# Patient Record
Sex: Female | Born: 1935 | Race: White | Hispanic: No | Smoking: Former smoker
Health system: Southern US, Community
[De-identification: ages and names within clinical notes are randomized; demographics above are authoritative.]

## PROBLEM LIST (undated history)

## (undated) DIAGNOSIS — K219 Gastro-esophageal reflux disease without esophagitis: Secondary | ICD-10-CM

## (undated) DIAGNOSIS — J45909 Unspecified asthma, uncomplicated: Secondary | ICD-10-CM

## (undated) DIAGNOSIS — G20 Parkinson's disease: Secondary | ICD-10-CM

## (undated) DIAGNOSIS — H532 Diplopia: Secondary | ICD-10-CM

## (undated) DIAGNOSIS — E876 Hypokalemia: Secondary | ICD-10-CM

## (undated) DIAGNOSIS — I10 Essential (primary) hypertension: Secondary | ICD-10-CM

## (undated) DIAGNOSIS — C50919 Malignant neoplasm of unspecified site of unspecified female breast: Secondary | ICD-10-CM

## (undated) DIAGNOSIS — M1990 Unspecified osteoarthritis, unspecified site: Secondary | ICD-10-CM

## (undated) DIAGNOSIS — K56609 Unspecified intestinal obstruction, unspecified as to partial versus complete obstruction: Secondary | ICD-10-CM

## (undated) DIAGNOSIS — I951 Orthostatic hypotension: Secondary | ICD-10-CM

## (undated) DIAGNOSIS — I4891 Unspecified atrial fibrillation: Secondary | ICD-10-CM

## (undated) DIAGNOSIS — M419 Scoliosis, unspecified: Secondary | ICD-10-CM

## (undated) HISTORY — DX: Unspecified intestinal obstruction, unspecified as to partial versus complete obstruction: K56.609

## (undated) HISTORY — DX: Parkinson's disease: G20

## (undated) HISTORY — DX: Diplopia: H53.2

## (undated) HISTORY — DX: Scoliosis, unspecified: M41.9

## (undated) HISTORY — DX: Orthostatic hypotension: I95.1

## (undated) HISTORY — PX: TONSILLECTOMY: SUR1361

## (undated) HISTORY — DX: Unspecified atrial fibrillation: I48.91

## (undated) HISTORY — PX: CATARACT EXTRACTION: SUR2

## (undated) HISTORY — DX: Unspecified osteoarthritis, unspecified site: M19.90

## (undated) HISTORY — DX: Hypokalemia: E87.6

## (undated) HISTORY — PX: MASTECTOMY: SHX3

## (undated) HISTORY — DX: Gastro-esophageal reflux disease without esophagitis: K21.9

## (undated) HISTORY — DX: Unspecified asthma, uncomplicated: J45.909

## (undated) HISTORY — DX: Essential (primary) hypertension: I10

## (undated) HISTORY — DX: Malignant neoplasm of unspecified site of unspecified female breast: C50.919

## (undated) HISTORY — PX: LUMBAR SPINE SURGERY: SHX701

## (undated) HISTORY — PX: TOTAL HIP ARTHROPLASTY: SHX124

---

## 2006-07-05 ENCOUNTER — Encounter: Admission: RE | Admit: 2006-07-05 | Discharge: 2006-07-05 | Payer: Self-pay | Admitting: Orthopaedic Surgery

## 2006-07-08 ENCOUNTER — Inpatient Hospital Stay (HOSPITAL_COMMUNITY): Admission: RE | Admit: 2006-07-08 | Discharge: 2006-07-12 | Payer: Self-pay | Admitting: Orthopaedic Surgery

## 2007-05-16 ENCOUNTER — Inpatient Hospital Stay (HOSPITAL_COMMUNITY): Admission: EM | Admit: 2007-05-16 | Discharge: 2007-05-21 | Payer: Self-pay | Admitting: Emergency Medicine

## 2007-06-01 DIAGNOSIS — K56609 Unspecified intestinal obstruction, unspecified as to partial versus complete obstruction: Secondary | ICD-10-CM | POA: Insufficient documentation

## 2007-06-01 HISTORY — DX: Unspecified intestinal obstruction, unspecified as to partial versus complete obstruction: K56.609

## 2009-02-06 ENCOUNTER — Inpatient Hospital Stay (HOSPITAL_COMMUNITY): Admission: EM | Admit: 2009-02-06 | Discharge: 2009-02-13 | Payer: Self-pay | Admitting: Emergency Medicine

## 2010-06-03 LAB — CBC
HCT: 33.8 % — ABNORMAL LOW (ref 36.0–46.0)
HCT: 34.1 % — ABNORMAL LOW (ref 36.0–46.0)
HCT: 34.4 % — ABNORMAL LOW (ref 36.0–46.0)
Hemoglobin: 11.1 g/dL — ABNORMAL LOW (ref 12.0–15.0)
Hemoglobin: 11.3 g/dL — ABNORMAL LOW (ref 12.0–15.0)
Hemoglobin: 11.4 g/dL — ABNORMAL LOW (ref 12.0–15.0)
Hemoglobin: 11.6 g/dL — ABNORMAL LOW (ref 12.0–15.0)
Hemoglobin: 11.9 g/dL — ABNORMAL LOW (ref 12.0–15.0)
Hemoglobin: 13.2 g/dL (ref 12.0–15.0)
MCHC: 33.1 g/dL (ref 30.0–36.0)
MCHC: 33.6 g/dL (ref 30.0–36.0)
MCV: 95.2 fL (ref 78.0–100.0)
MCV: 96.3 fL (ref 78.0–100.0)
Platelets: 244 10*3/uL (ref 150–400)
RBC: 3.5 MIL/uL — ABNORMAL LOW (ref 3.87–5.11)
RBC: 3.51 MIL/uL — ABNORMAL LOW (ref 3.87–5.11)
RBC: 3.58 MIL/uL — ABNORMAL LOW (ref 3.87–5.11)
RBC: 3.59 MIL/uL — ABNORMAL LOW (ref 3.87–5.11)
RBC: 3.69 MIL/uL — ABNORMAL LOW (ref 3.87–5.11)
RBC: 4.13 MIL/uL (ref 3.87–5.11)
RDW: 13.2 % (ref 11.5–15.5)
RDW: 14.2 % (ref 11.5–15.5)
WBC: 11 10*3/uL — ABNORMAL HIGH (ref 4.0–10.5)
WBC: 13.3 10*3/uL — ABNORMAL HIGH (ref 4.0–10.5)
WBC: 7.7 10*3/uL (ref 4.0–10.5)
WBC: 8 10*3/uL (ref 4.0–10.5)
WBC: 8.7 10*3/uL (ref 4.0–10.5)
WBC: 9.1 10*3/uL (ref 4.0–10.5)

## 2010-06-03 LAB — URINALYSIS, MICROSCOPIC ONLY
Bilirubin Urine: NEGATIVE
Ketones, ur: NEGATIVE mg/dL
Protein, ur: NEGATIVE mg/dL
Urobilinogen, UA: 0.2 mg/dL (ref 0.0–1.0)
pH: 6.5 (ref 5.0–8.0)

## 2010-06-03 LAB — URINE CULTURE: Culture: NO GROWTH

## 2010-06-03 LAB — BASIC METABOLIC PANEL
BUN: 5 mg/dL — ABNORMAL LOW (ref 6–23)
BUN: 7 mg/dL (ref 6–23)
BUN: 7 mg/dL (ref 6–23)
CO2: 27 mEq/L (ref 19–32)
Calcium: 8.4 mg/dL (ref 8.4–10.5)
Calcium: 8.6 mg/dL (ref 8.4–10.5)
Chloride: 102 mEq/L (ref 96–112)
Chloride: 104 mEq/L (ref 96–112)
Chloride: 104 mEq/L (ref 96–112)
Creatinine, Ser: 0.59 mg/dL (ref 0.4–1.2)
Creatinine, Ser: 0.63 mg/dL (ref 0.4–1.2)
GFR calc Af Amer: >60 mL/min (ref >60)
GFR calc Af Amer: >60 mL/min (ref >60)
GFR calc Af Amer: >60 mL/min (ref >60)
GFR calc non Af Amer: >60 mL/min (ref >60)
GFR calc non Af Amer: >60 mL/min (ref >60)
GFR calc non Af Amer: >60 mL/min (ref >60)
GFR calc non Af Amer: >60 mL/min (ref >60)
Glucose, Bld: 127 mg/dL — ABNORMAL HIGH (ref 70–99)
Glucose, Bld: 134 mg/dL — ABNORMAL HIGH (ref 70–99)
Potassium: 3.7 mEq/L (ref 3.5–5.1)
Potassium: 3.9 mEq/L (ref 3.5–5.1)
Potassium: 4.2 mEq/L (ref 3.5–5.1)
Sodium: 136 mEq/L (ref 135–145)
Sodium: 136 mEq/L (ref 135–145)
Sodium: 137 mEq/L (ref 135–145)
Sodium: 138 mEq/L (ref 135–145)

## 2010-06-03 LAB — POCT I-STAT, CHEM 8
BUN: 15 mg/dL (ref 6–23)
Creatinine, Ser: 0.8 mg/dL (ref 0.4–1.2)
Hemoglobin: 13.9 g/dL (ref 12.0–15.0)
Potassium: 3.9 mEq/L (ref 3.5–5.1)
Sodium: 133 mEq/L — ABNORMAL LOW (ref 135–145)

## 2010-06-03 LAB — PROTIME-INR
INR: 0.97 (ref 0.00–1.49)
INR: 2.7 — ABNORMAL HIGH (ref 0.00–1.49)
INR: 3.67 — ABNORMAL HIGH (ref 0.00–1.49)
INR: 4.17 — ABNORMAL HIGH (ref 0.00–1.49)
Prothrombin Time: 16.4 seconds — ABNORMAL HIGH (ref 11.6–15.2)
Prothrombin Time: 19.2 seconds — ABNORMAL HIGH (ref 11.6–15.2)
Prothrombin Time: 36.2 seconds — ABNORMAL HIGH (ref 11.6–15.2)

## 2010-06-03 LAB — DIFFERENTIAL
Lymphocytes Relative: 5 % — ABNORMAL LOW (ref 12–46)
Monocytes Absolute: 0.5 10*3/uL (ref 0.1–1.0)
Monocytes Relative: 4 % (ref 3–12)
Neutro Abs: 11.9 10*3/uL — ABNORMAL HIGH (ref 1.7–7.7)
Neutrophils Relative %: 90 % — ABNORMAL HIGH (ref 43–77)

## 2010-06-03 LAB — URINALYSIS, ROUTINE W REFLEX MICROSCOPIC
Glucose, UA: NEGATIVE mg/dL
Hgb urine dipstick: NEGATIVE
Ketones, ur: NEGATIVE mg/dL
Nitrite: POSITIVE — AB
Protein, ur: NEGATIVE mg/dL
Specific Gravity, Urine: 1.015 (ref 1.005–1.030)
pH: 7.5 (ref 5.0–8.0)

## 2010-06-03 LAB — URINE MICROSCOPIC-ADD ON

## 2010-06-03 LAB — TYPE AND SCREEN: Antibody Screen: NEGATIVE

## 2010-07-15 NOTE — H&P (Signed)
Leslie Stevens, Leslie Stevens             ACCOUNT NO.:  [id number]   MEDICAL RECORD NO.:  [id number]          PATIENT TYPE:  INP   LOCATION:  2010                         FACILITY:  [facility]   PHYSICIAN:  Revonda Standard L. Rennis Harding, N.P. DATE OF BIRTH:  27-Jan-1928   DATE OF ADMISSION:  05/16/2007  DATE OF DISCHARGE:                              HISTORY & PHYSICAL   PRIMARY CARE PHYSICIAN:  Dr. Elana Alm. Nicholos Johns, M.D.   CARDIOLOGIST:  Dr. Corky Crafts, MD   CHIEF COMPLAINT:  Abdominal pain with nausea, vomiting.   HISTORY OF PRESENT ILLNESS:  [name] is a 77 year old female patient,  no prior abdominal surgeries.  This past Friday, she had onset of  anorexia, during the night developed nocturia and by Saturday had  increasing anorexia.  By Saturday night, developed persistent bilious  nausea and vomiting.  Since that time she has had no BM or flatus.  The  patient also developed left lower quadrant abdominal pain which she  described as quite severe.  Her family persuaded her to come to the ER,  where she was evaluated today by the emergency room physician, Dr.  Ethelda Chick.  Her white count was elevated at 18,600 with a left shift.  She has not been having fever, since arrival.  Urinalysis negative.  Her  lipase was mildly elevated at 95, and her CT showed a probable mid-ileal  small bowel obstruction.  Surgical consultation was requested.   REVIEW OF SYSTEMS:  CONSTITUTIONAL:  No fever with the onset of the  symptoms.  GI:  No chronic dysphasia, no acute diarrhea, no chronic  postprandial bloating or pain.  No recent weight gain or weight loss.  She has a history remote of similar symptoms without nausea and vomiting  at that time, crampy abdominal pain and flu-like symptoms.  No fever or  diarrhea with that, spontaneously resolved.  She did seek medical  attention with Dr. Renato Gails at that time.  She also reports chronic  constipation, because of being on chronic pain meds and having low back  pain.  Recently, she has had increased use of the pain medications and  decreased mobility and increased laxative use.  MUSCULOSKELETAL:  As  noted, increasing low back pain, decreasing mobility, increased use of  tramadol.  GENITOURINARY:  She has a history of recurrent UTIs and  marked decreased urine output since the onset of the symptoms,  essentially no urine output since Sunday.   PAST MEDICAL HISTORY:  1. Asthma.  2. Low back pain/osteoarthritis.  3. Hypertension.  4. Duodenal ulcer, remote.   PAST SURGICAL HISTORY:  1. L4-L5 laminectomy/peak cage/bone graft/bruise, and this is in May      of 2008 by Dr. Noel Gerold.  2. Left mastectomy 1992, done at [facility].   SOCIAL HISTORY:  Lives alone.  No alcohol.  No tobacco.   FAMILY HISTORY:  Noncontributory.   ALLERGIES:  NONE.   MEDICATIONS AT HOME:  Include:  Advair metoprolol and Ultram, dosages  unknown.   PHYSICAL EXAM:  GENERAL:  Pleasant female patient complaining of nausea  and vomiting since Saturday night, with left  lower quadrant abdominal  pain.  VITAL SIGNS:  Temperature 97.5, BP 120/79, pulse is 118, respirations  20.  PSYCH:  The patient is alert when x3.  Her affect is appropriate.  NEURO:  Patient is moving all extremities x4 with equal strength and  sensation 5/5.  Cranial nerves II-XII are grossly intact.  HEENT:  Eyes are slightly injected, dry appearing, otherwise normal.  Ear, nose and throat and oral mucous membranes are dry.  Nose is without  rhinorrhea.  Ears are symmetrical in appearance without lesions.  NECK:  Supple.  Trachea midline.  Thyroid not palpable.  CHEST:  Bilateral lung sounds are clear to auscultation.  Respiratory  effort is non-labored.  She is on room air.  CARDIAC:  S1-S2 without rubs, murmurs.  She is slightly tachycardiac on  exam.  No obvious JVD.  Actually, her jugulars appear somewhat  flattened, no peripheral edema.  ABDOMEN:  Distended and soft.  Warm to the touch.  No  surgical scars.  No bowel sounds auscultated.  She is tender in the left lower quadrant,  no guarding and no rebounding.  The abdomen itself feels spongy and  full, and it feels as if when I press down, I am causing bowel  peristalsis with my pressure, possibly moving retained stool around.  No  obvious hepatosplenomegaly, masses or hernias noted.  EXTREMITIES:  Are symmetrical in appearance.  No cyanosis, no clubbing.   LABS:  Sodium 126, potassium 3.3, CO2 32, chloride 80, glucose 120, BUN  25, creatinine 1.03.  White count 18,600 with a neutrophil count of 87%,  hemoglobin is 15.7, platelets 456,000.  Urinalysis negative.  Lipase 95.  Diagnostic CT as noted.  No plain x-rays were done with   IMPRESSION:  1. Small-bowel obstruction with associated nausea and vomiting and      left lower quadrant abdominal pain, uncertain etiology.  2. Volume depletion and dehydration secondary to nausea, vomiting with      associated electrolyte imbalance.  3. Hypertension on beta blocker.  4. Asthma compensated, no respiratory issues.  5. Chronic low back pain on Ultram.  6. Constipation, worsened recently.   PLAN:  1. Admit to the floor as inpatient status n.p.o. NG tube to level of      suction, IV fluid for hydration and bowel rest.  CT also appears as      if she has feces, as well as a bowel obstruction.  Feces is noted      in the colon, as well as there is some air in the colon, and there      appears to be a fecal ball in the rectum area, uncertain if the      small bowel obstruction at this point is coming from obstipation or      other symptoms.  Will at least attempt a Dulcolax tonight, to see      if this will help relieve any constipation.  Also, the CT contrast      moving through the small bowel down through the colon will help      relieve any fecal retention that is causing a bowel obstruction, if      this is the etiology.  2. Repeat CBC, BMET and lipase in the morning.  Repeat  a two-view      abdominal x-ray to follow small bowel obstruction.  3. The patient has leukocytosis.  More than likely this is due to      acute volume depletion,  begin empiric Zosyn for gram-negative      coverage as precaution.  I do not see free air on the CT, so I do      not suspect a bowel perforation or an abscess at this point.  4. The patient has marked electrolyte imbalance with hyponatremia,      hypokalemia, as well as compensatory metabolic alkalosis from her      dehydration.  Will give D5 normal saline which __________ 125 an      hour, increase further if needed.  5. The patient normally on beta blocker hypertension.  To prevent beta      blocker withdrawal, will give low dose IV Lopressor q.6 h. with      hold parameters on the orders.  6. Will utilize IV morphine for pain and Zofran for nausea.  Also,      standing orders of when to call physician, based on vital sign      changes or condition changes.      Allison L. Rennis Harding, N.P.     ALE/MEDQ  D:  05/16/2007  T:  05/16/2007  Job:  [id number]   cc:   Molly Maduro A. Nicholos Johns, M.D.  Corky Crafts, MD

## 2010-07-18 NOTE — Discharge Summary (Signed)
NAMEADLINE, KIRSHENBAUM             ACCOUNT NO.:  [id number]   MEDICAL RECORD NO.:  [id number]          PATIENT TYPE:  INP   LOCATION:  2010                         FACILITY:  [facility]   PHYSICIAN:  Lennie Muckle, MD      DATE OF BIRTH:  [birth year]   DATE OF ADMISSION:  05/16/2007  DATE OF DISCHARGE:  05/21/2007                               DISCHARGE SUMMARY   CHIEF COMPLAINT/REASON FOR ADMISSION:  Ms. Dalby is a 77 year old  female, no prior abdominal surgery, reported abrupt onset of anorexia  over the past 24 hours.  Initially developed symptoms of nocturia and by  the morning had developed anorexia.  During the evening, developed  persistent bilious nausea and vomiting, and by the time she presented to  the ER, denied ability to have flatus or BM.  She was complaining of  left lower quadrant abdominal pain, which was described as quite severe.  In the ER, she was found have a white count of 18,600.  Lipase was  mildly elevated at 95 and a CT scan of the abdomen and pelvis revealed a  probable mid ileal small bowel obstruction.  Surgical consultation was  requested.   In ER, the patient was examined.  Her vital signs were stable.  She was  afebrile.  Her abdomen was distended and soft, warm to the touch.  No  surgical scars.  No bowel sounds auscultated.  Tender at left lower  quadrant without guarding or rebounding.  Abdomen was spongy and full  and felt as if she  may have retained stool in the bowel.  There was a  sensation with palpating of bowel peristalsis beneath by hands.  Again,  she had elevated white count with a left shift.  Her sodium was low at  126, potassium 3.3, and mildly elevated lipase.  The patient was  admitted with the following diagnoses:  1. Small-bowel obstruction with nausea and vomiting and associated      left lower quadrant abdominal pain, uncertain etiology.  No prior      surgery.  2. Volume depletion and dehydration due to nausea and vomiting and    associated electrolyte imbalance.  3. Hypertension, on beta-blocker.  4. Asthma, compensated.  5. Chronic low back pain, on Ultram.  6. History of constipation, worsened recently.   HOSPITAL COURSE:  The patient was admitted to the general floor when NG  tube was placed to begin bowel decompression and bowel rest.  IV fluids  were started.  On review of the patient's CT, it was noted she had  significant amount of feces in the colon, air in the colon, and a fecal  ball in the rectal area.  She also had somewhat of a small bowel  obstruction and uncertain at this time if her bowel obstructive symptoms  were coming from mechanical issues related to obstipation.   Within the first 24 hours of hospitalization, the patient's white count  had decreased to 16,300.  Her neutrophils were still mildly elevated at  84%.  With IV fluid hydration, her potassium had corrected and her  sodium was up  to 128.  Her lipase was still at 100.  Plain film x-ray  show air and contrast in the colon, loops of dilated small bowel in the  mid abdomen, looping into the pelvis, more consistent with a partial  small bowel obstruction.  Clinically, the patient was stable with NG  tube in place.  She was not passing flatus but did have a small bowel  movement after the use of Dulcolax.  She had been placed empirically on  Zosyn to cover any potential infectious etiology to her symptoms.  Unfortunately, she continued with large volume bilious output through  her NG tube.  At this point, it was determined to continue NG tube and  bowel rest, and follow her clinically and with x-rays as well as to  continue to correct any problems related to hypokalemia, which was an  intermittent problem during the hospitalization.   By hospital day #3, the patient had no further nausea.  She was passing  flatus.  Her electrolytes had normalized.  Her NG tube was discontinued  and she was started on clear liquid diet.  By hospital  day #4, May 20, 2007, the patient was having bowel movement and diarrhea, tolerating a  clear liquid diet.  Her diet was advanced and it was felt that the  etiology to her symptoms were more related to constipation.  By May 21, 2007, the patient had returned to her usual state of health.  Her  diarrhea had resolved.  She had no nausea.  She was tolerating a solid  diet.  Otherwise, recommended to discharge home.  It was felt that the  etiology to her symptoms were more related to obstipation, therefore,  she was continued on regular stool softener and a bowel regimen prior to  discharge.   DISCHARGE DIAGNOSES:  1. Partial small bowel obstruction/ileus, secondary to obstipation,      resolved.  2. Asthma, controlled.  3. Hypertension, controlled.   DISCHARGE MEDICATIONS:  1. Advair 250/50 one puff b.i.d.  2. Metoprolol 12.5 mg b.i.d.  3. Tramadol 50 mg 1-2 tablets every 4-6 hours as needed for pain.  4. Over-the-counter stool softener 2-3 times daily to prevent      constipation.  5. If continued to have problems with constipation after beginning      Colace/docusate sodium, may wish to begin MiraLax 17 g with 8      ounces of fluid daily at hour of sleep.   DIET:  No restrictions.   WOUND CARE:  Not applicable.   ACTIVITY:  No restrictions.   FOLLOWUP:  She needs to see her family care doctor, Dr. Elias Else in  1-2 weeks.  She needs to call for an appointment.      Allison L. Kennith Center, MD  Electronically Signed    ALE/MEDQ  D:  06/13/2007  T:  06/14/2007  Job:  [id number]   cc:   Molly Maduro A. Nicholos Johns, M.D.

## 2010-07-18 NOTE — Discharge Summary (Signed)
Leslie Stevens, Leslie Stevens               ACCOUNT NO.:  [id number]   MEDICAL RECORD NO.:  [id number]          PATIENT TYPE:  INP   LOCATION:  5029                         FACILITY:  [facility]   PHYSICIAN:  Sharolyn Douglas, M.D.        DATE OF BIRTH:  [birth year]   DATE OF ADMISSION:  07/08/2006  DATE OF DISCHARGE:  07/12/2006                               DISCHARGE SUMMARY   ADMISSION DIAGNOSIS:  Spinal stenosis, L4-L5, degenerative lumbar  scoliosis.   SECONDARY DIAGNOSES:  1. History of breast malignancy.  2. History of atrial fibrillation.  3. History of tobacco abuse.  4. History of asthma.  5. History of urinary tract infections.  6. History of skin cancer.   PROCEDURES PERFORMED:  Lumbar L4-L5 decompression and posterior spinal  fusion using pedicle screws, TLIF and BMP.   CONSULTATIONS:  None.   DISCHARGE DIAGNOSES:  1. Spinal stenosis, L4-L5, degenerative lumbar scoliosis.  2. Urinary tract infection.   BRIEF HISTORY:  The patient is a 77 year old female with a several-year  history of severe low back pain, spinal stenosis, leg pain, unrelieved  with conservative measures, and she elected to proceed with surgical  intervention of lumbar decompression, L4-L5, and posterior spinal fusion  with pedicle screws, TLIF and bone morphogenic protein.  The risks,  complications and postoperative expectations were explained to the  patient in detail and she elected to proceed.   HOSPITAL COURSE:  The patient was admitted on Jul 08, 2006, and underwent  lumbar L4-L5 posterior spinal fusion with pedicle screws, TLIF and BMP.  She tolerated the procedure well.  Estimated blood loss was 150-200 mL.  Postoperatively the patient was alert, no focal deficits, motor and  sensation within normal limits.  A Hemovac drain was in place.  A Foley  catheter was in place.  She was placed on vancomycin x24 hours as  prophylaxis against infection, PCA morphine pump, Vicodin and Robaxin  for pain control, TED  hose and sequential compression devices for  prophylaxis against DVT.  She started with physical therapy and  occupational therapy, care management for discharge planning.   On the first day postoperatively, the patient was doing well, vital  signs were stable, afebrile, hemoglobin 11.6, hematocrit 34.0.  Chemistry indices within normal limits.  The wound dressing was dry, the  Hemovac was in place.  Worked with therapy to get out of bed for  progressive ambulation.  Weaned off the PCA to p.o. pain medications.  Albuterol inhaler p.r.n.  Discharge planning with care management.   On the second day postoperatively, continued to do well, afebrile, vital  signs stable.  Urine culture was positive for Pseudomonas sensitive to  Cipro.  The patient's Hemovac was discontinued, the dressing was  removed, she had a small skin abrasion from the adhesives, DuoDerm was  applied to the abrasion.  The Foley catheter was discontinued and we  continued with discharge plans.  She was also advancing her diet slowly.   She was ready for discharge home on Jul 12, 2006.  To continue on Cipro  500 mg twice per day for  seven days for the UTI.  Given a prescription  for Vicodin 5/500 one p.o. every 6 hours as needed for pain; Robaxin 500  mg one p.o. every 8 hours as needed for spasm; calcium 500 mg twice per  day; Colace 100 mg twice per day; multivitamin one p.o. once per day.  To follow up with Dr. Noel Gerold in two weeks.  Continue physical therapy out  of bed and progressive ambulation.  No lifting greater than 5 pounds, no  repetitive bending, stooping, squatting or twisting.  LSO brace when  ambulating and driving in the car.  Continue home medications.  IV was  discontinued.  Advanced diet.   CONDITION ON DISCHARGE:  Stable and improved.   DISCHARGE INSTRUCTIONS:  Discharge home on Jul 12, 2006.  General medial  equipment as needed.  Home health, physical therapy, progressive  ambulation, LSO with  ambulation.  Vicodin and Robaxin for pain control.  Continue on quinine one p.o. at bedtime; Advair one puff every 12 hours  as needed; metoprolol one-half 50 mg tablet daily; albuterol one puff as  needed every 6 hours; Ultram 50 mg one p.o. every 6 hours as needed;  Centrum Silver; potassium; magnesium; flax seed oil; vitamin C; Colace  100 mg twice per day; calcium 500 mg twice per day; multivitamin one  p.o. once per day.   LABORATORY INVESTIGATIONS:  Pertinent labs can be obtained from the  permanent hospital record.  Hemoglobin on admission was 13.8, at the  time of discharge 11.9.  PT 13.2, PTT 30.  Discharge chemistries:  Sodium 140, potassium 3.6, all other chemistries can be obtained from  the permanent hospital record.  UTI culture positive for Pseudomonas,  sensitive to Cipro.  All other lab results can be obtained from the  permanent hospital record.      [name].      Sharolyn Douglas, M.D.  Electronically Signed    SCI/MEDQ  D:  08/27/2006  T:  08/28/2006  Job:  [id number]

## 2010-07-18 NOTE — Op Note (Signed)
NAMEJEANETT, ANTONOPOULOS               ACCOUNT NO.:  [id number]   MEDICAL RECORD NO.:  [id number]          PATIENT TYPE:  INP   LOCATION:  2899                         FACILITY:  [facility]   PHYSICIAN:  Sharolyn Douglas, M.D.        DATE OF BIRTH:  1927/05/04   DATE OF PROCEDURE:  07/08/2006  DATE OF DISCHARGE:                               OPERATIVE REPORT   PREOPERATIVE DIAGNOSIS:  1. Lumbar spinal stenosis.  2. L4-5 degenerative spondylolisthesis.  3. Lumbar scoliosis.   PROCEDURE:  1. Lumbar laminectomy L3-4 and L4-5 with wide decompression of the      thecal sac and nerve roots bilaterally.  2. L4-5 transforaminal lumbar interbody fusion with placement of 7-mm      PEEK cage.  3. Posterior spinal arthrodesis L4-5  4. Pedicle screw instrumentation L4-5 using the Abbott spine system.  5. Local autogenous bone graft supplemented with BMP.   SURGEON:  Sharolyn Douglas, MD   ASSISTANT:  Aura Fey. Ireton, P.A.-C.   COMPLICATIONS:  None.   COUNTS:  Needle and sponge count correct.   INDICATIONS:  The patient is a pleasant, 77 year old female with  persistent and progressive back and bilateral lower extremity pain.  Her  imaging studies show a rotatory scoliosis with asymmetric collapse at L4-  5 below the curvature.  There is a subtle spondylolisthesis of L4 and  L5.  MRI scan shows severe lumbar spinal stenosis at L4-5 and less so at  L3-4.  She now presents for lumbar decompression and fusion in hopes of  improving her symptoms.  Risks, benefits, alternatives were reviewed.  The patient elected to proceed.   DESCRIPTION OF PROCEDURE:  After informed consent she was taken to the  operating room.  She underwent general endotracheal anesthesia without  difficulty given prophylactic IV antibiotics.  Neuro monitoring was  established in form of lower extremity EMGs and SSEP.  She was carefully  turned prone onto the Wilson frame.  All bony prominences padded.  Face  and eyes protected at all times.   Back prepped and draped in the usual  sterile fashion.   A midline incision was made from L3 to L5.  Dissection was carried  through the deep fascia.  Subperiosteal exposure carried out to the tips  the transverse process of L4 and L5.  Deep retractor placed.  Intraoperative x-ray was taken to confirm the levels.  We then begin the  laminectomy by removing the entire spinous process and lamina of L4.  We  found that the joints were hypertrophied.  The ligamentum flavum had  also enlarged and was creating severe spinal stenosis.  The lateral  recesses were narrowed due to the facet joints and ligamentum flavum  overgrowth.  This was all radically decompressed out flush with the L5  pedicles.  The decompression was carried proximally, removing the  inferior one-third of the L3 spinous process and lamina.  The lamina was  undercut removing the ligamentum flavum above.  We also decompressed the  facet joints at L3-4 by undercutting joints and decompressing the  ligamentum flavum.  We then  turned our attention to placing pedicle  screws at L4 and L5 using anatomic probing technique.  We could palpate  the pedicles from within the spinal canal.  Due to the patient's  rotatory scoliosis, the screws were directed more medially on the right  side and less medially on the left.  We placed 6.5 x 50 mm screws at L4,  6.5 x 40 mm screws at L5.  Considering the patient's age and bone  quality, The screw purchase was quite good.  The screws were palpated  from within the spinal canal and also before placing the screws within  the pedicle tract holes and we did not encounter any breeches.  The  screw was stimulated using triggered EMGs.  The reading was 6 mA for the  left L5 screws.  We reevaluated the screw and again did not determine  that there were any breeches and felt confident that the screw could be  left in position.   We then turned our attention to completing the posterior spinal fusion  by  decorticating the transverse processes of L4 and  L5 and packing the  local bone graft, which was collected from laminectomy into the lateral  gutters.  We then turned our attention to performing a transforaminal  lumbar interbody fusion at L4-5.  A radical facetectomy was completed on  the right side.  The exiting transversing nerve roots were identified  and protected at all times.  Free running EMGs were monitored.  Due to  the severe disk space collapse and scoliosis, we could barely get a  Penfield into the interspace.  The disk space was gently dilated up  using intervertebral spreaders to 7 mm.  We then used curved curettes to  scrape out the remaining disk and also the cartilaginous end plates.  The disk space was packed with local bone graft along with BMP sponges  from the small infuse kit.  We then inserted the 7 mm PEEK cage packed  with the infuse sponge into the interspace, tamped it anteriorly across  the midline.  We then placed short segment titanium rods into the  polyaxial screw heads.  Compression was applied on the left side before  shearing off the locking caps.  Intraoperative x-ray showed the  instrumentation at L4-5 in acceptable position.  Hemostasis was  achieved.  Gelfoam was left over the exposed epidural space.  Deep  Hemovac drain left in place.  Deep fascia closed with a running #1  Vicryl suture.  Subcutaneous layer closed with 2-0 Vicryl followed by a  running 3-0 subcuticular Vicryl suture on skin edges.  Dermabond was  applied.  Sterile dressing placed.  Patient returned supine, extubated  without difficulty, transferred to recovery in stable condition.  She  was neurologically intact.   It should be noted my assistant, Orlin Hilding, PA-C,  assisted throughout  the procedure including positioning, exposure, decompression,  instrumentation, and  arthrodesis.  She assisted with wound closure. She worked with me using the ___________ retractor, carefully  protecting  neural elements and utilizing the sucker.      Sharolyn Douglas, M.D.  Electronically Signed     MC/MEDQ  D:  07/08/2006  T:  07/08/2006  Job:  [id number]

## 2010-11-24 LAB — CBC
HCT: 39.7
HCT: 46.4 — ABNORMAL HIGH
Hemoglobin: 14.4
MCHC: 33.9
MCHC: 34
MCV: 92.1
MCV: 93.3
Platelets: 456 — ABNORMAL HIGH
RBC: 4.26
RBC: 4.51
RDW: 13.1
RDW: 13.9
WBC: 16.3 — ABNORMAL HIGH
WBC: 9.3

## 2010-11-24 LAB — COMPREHENSIVE METABOLIC PANEL
ALT: 22
ALT: 23
AST: 25
Albumin: 3 — ABNORMAL LOW
Albumin: 3.1 — ABNORMAL LOW
Albumin: 4.1
BUN: 25 — ABNORMAL HIGH
Calcium: 8.6
Calcium: 9
Calcium: 9.7
Chloride: 100
Creatinine, Ser: 1.03
GFR calc Af Amer: >60
GFR calc Af Amer: >60
Glucose, Bld: 128 — ABNORMAL HIGH
Potassium: 3.2 — ABNORMAL LOW
Potassium: 3.3 — ABNORMAL LOW
Sodium: 138
Total Protein: 6
Total Protein: 6.2
Total Protein: 7.4

## 2010-11-24 LAB — URINALYSIS, ROUTINE W REFLEX MICROSCOPIC
Hgb urine dipstick: NEGATIVE
Specific Gravity, Urine: 1.029
Urobilinogen, UA: 0.2

## 2010-11-24 LAB — BASIC METABOLIC PANEL
CO2: 29
Calcium: 9.1
GFR calc Af Amer: >60
GFR calc non Af Amer: 56 — ABNORMAL LOW
Sodium: 128 — ABNORMAL LOW

## 2010-11-24 LAB — DIFFERENTIAL
Basophils Absolute: 0.1
Basophils Relative: 0
Eosinophils Absolute: 0
Lymphocytes Relative: 7 — ABNORMAL LOW
Lymphs Abs: 1.2
Lymphs Abs: 1.2
Monocytes Absolute: 0.9
Monocytes Absolute: 1.4 — ABNORMAL HIGH
Monocytes Relative: 10
Monocytes Relative: 7
Monocytes Relative: 9
Neutro Abs: 13.7 — ABNORMAL HIGH
Neutro Abs: 16.2 — ABNORMAL HIGH
Neutro Abs: 7.2
Neutrophils Relative %: 77
Neutrophils Relative %: 87 — ABNORMAL HIGH

## 2010-11-24 LAB — URINE MICROSCOPIC-ADD ON

## 2010-11-24 LAB — LIPID PANEL
Cholesterol: 129
HDL: 64
LDL Cholesterol: 52
Total CHOL/HDL Ratio: 2

## 2012-05-05 DIAGNOSIS — G250 Essential tremor: Secondary | ICD-10-CM | POA: Insufficient documentation

## 2012-05-05 DIAGNOSIS — R269 Unspecified abnormalities of gait and mobility: Secondary | ICD-10-CM | POA: Insufficient documentation

## 2012-09-26 ENCOUNTER — Encounter: Payer: Self-pay | Admitting: Neurology

## 2012-09-26 DIAGNOSIS — R269 Unspecified abnormalities of gait and mobility: Secondary | ICD-10-CM

## 2012-09-26 DIAGNOSIS — G20 Parkinson's disease: Secondary | ICD-10-CM

## 2012-09-26 DIAGNOSIS — G250 Essential tremor: Secondary | ICD-10-CM

## 2012-09-27 ENCOUNTER — Ambulatory Visit (INDEPENDENT_AMBULATORY_CARE_PROVIDER_SITE_OTHER): Payer: Medicare Other | Admitting: Neurology

## 2012-09-27 ENCOUNTER — Encounter: Payer: Self-pay | Admitting: Neurology

## 2012-09-27 VITALS — BP 169/96 | HR 70

## 2012-09-27 DIAGNOSIS — G20 Parkinson's disease: Secondary | ICD-10-CM

## 2012-09-27 DIAGNOSIS — R269 Unspecified abnormalities of gait and mobility: Secondary | ICD-10-CM

## 2012-09-27 DIAGNOSIS — H532 Diplopia: Secondary | ICD-10-CM

## 2012-09-27 HISTORY — DX: Diplopia: H53.2

## 2012-09-27 NOTE — Progress Notes (Signed)
Reason for visit: Parkinson's disease  Leslie Stevens is an 79 y.o. female  History of present illness:  Leslie Stevens is an 79 year old right-handed white female with a history of Parkinson's disease associated with a gait disorder. The patient is on very low-dose Sinemet taking one half of a 25/100 mg CR tablet 3 times daily. The patient is having some drowsiness from the medication. The patient has very limited mobility, mainly because of severe arthritis affecting her right knee. The patient is able to walk with a walker for short distances, but she finds this very painful. The patient also has severe scoliosis of the spine. The patient has tremors affecting mainly the right upper extremity. The patient is on propranolol taking 40 mg twice daily. The patient has over the last month or 2 noted some problems with double vision. The patient notes that the double vision is primarily horizontal in nature. The patient may have some slight ptosis of the right eye in the evening hours. The patient returns to this office for an evaluation. No change in speech, swallowing, or chewing is noted.  Past Medical History  Diagnosis Date  . Parkinson's disease   . Hypertension   . Atrial fibrillation   . Breast cancer   . Degenerative arthritis   . Diplopia 09/27/2012  . Scoliosis     Past Surgical History  Procedure Laterality Date  . Mastectomy    . Total hip arthroplasty Right   . Lumbar spine surgery    . Tonsillectomy    . Cataract extraction Bilateral     Family History  Problem Relation Age of Onset  . Breast cancer Mother   . Emphysema Father   . Obesity Sister     Social history:  reports that she quit smoking about 22 years ago. She does not have any smokeless tobacco history on file. She reports that  drinks alcohol. She reports that she does not use illicit drugs.  Allergies: No Known Allergies  Medications:  Current Outpatient Prescriptions on File Prior to Visit  Medication  Sig Dispense Refill  . aspirin 81 MG tablet Take 81 mg by mouth daily.      . Carbidopa-Levodopa ER (SINEMET CR) 25-100 MG tablet controlled release Take 0.5 tablets by mouth 3 (three) times daily.       . digoxin (LANOXIN) 0.125 MG tablet Take 0.125 mg by mouth daily.      . Fluticasone-Salmeterol (ADVAIR) 250-50 MCG/DOSE AEPB Inhale 1 puff into the lungs every 12 (twelve) hours.      Marland Kitchen loratadine (CLARITIN) 10 MG tablet Take 10 mg by mouth as needed.       . mirabegron ER (MYRBETRIQ) 50 MG TB24 Take 50 mg by mouth daily.      . polyethylene glycol (MIRALAX / GLYCOLAX) packet Take 17 g by mouth daily as needed.        No current facility-administered medications on file prior to visit.    ROS:  Out of a complete 14 system review of symptoms, the patient complains only of the following symptoms, and all other reviewed systems are negative.  Fatigue Swelling in the legs Blurred vision, double vision, loss of vision Incontinence of bowel Easy bruising Joint pain, joint swelling Memory loss, confusion, weakness, insomnia  Blood pressure 169/96, pulse 70.  Physical Exam  General: The patient is alert and cooperative at the time of the examination.  Skin: No significant peripheral edema is noted.   Neurologic Exam  Cranial nerves: Facial  symmetry is present. Speech is normal, no aphasia or dysarthria is noted. Extraocular movements are full. Visual fields are full.  Motor: The patient has good strength in all 4 extremities.  Coordination: The patient has good finger-nose-finger and heel-to-shin bilaterally. A resting tremors noted with the right upper extremity.  Gait and station: The patient requires some assistance with standing. Once up, she can walk short distances, she seems to hesitate with turns. Tandem gait was not attempted. The patient usually walks with a walker. Romberg is negative. No drift is seen.  Reflexes: Deep tendon reflexes are symmetric, but are  depressed.   Assessment/Plan:  One. Parkinson's disease  2. Gait disorder  3. Degenerative arthritis  4. Diplopia  The patient is having significant problems with walking, mainly secondary to her arthritis issues. The patient will be maintained on her current dose of Sinemet. The patient will followup in 4 or 5 months. The patient will undergo some blood work looking for other etiologies of double vision.  Marlan Palau MD 09/27/2012 6:50 PM  [facility] [address] [place], [place] [address]  Phone [phone] Fax [phone]

## 2012-11-25 ENCOUNTER — Telehealth: Payer: Self-pay | Admitting: *Deleted

## 2012-11-25 NOTE — Telephone Encounter (Signed)
Leslie Stevens called about pt and the lab orders that Dr. Anne Hahn requesting from last note.  I called [facility] for call back assistant and let them know, B12, TSH, binding acetylcholine receptor.  I can fax or can call me back if questions.

## 2012-12-08 ENCOUNTER — Encounter: Payer: Self-pay | Admitting: *Deleted

## 2012-12-08 ENCOUNTER — Encounter: Payer: Self-pay | Admitting: Interventional Cardiology

## 2012-12-08 DIAGNOSIS — G20 Parkinson's disease: Secondary | ICD-10-CM | POA: Insufficient documentation

## 2012-12-08 DIAGNOSIS — J45909 Unspecified asthma, uncomplicated: Secondary | ICD-10-CM | POA: Insufficient documentation

## 2012-12-08 DIAGNOSIS — K219 Gastro-esophageal reflux disease without esophagitis: Secondary | ICD-10-CM | POA: Insufficient documentation

## 2012-12-08 DIAGNOSIS — E876 Hypokalemia: Secondary | ICD-10-CM | POA: Insufficient documentation

## 2012-12-08 DIAGNOSIS — C50919 Malignant neoplasm of unspecified site of unspecified female breast: Secondary | ICD-10-CM | POA: Insufficient documentation

## 2012-12-08 DIAGNOSIS — I4891 Unspecified atrial fibrillation: Secondary | ICD-10-CM | POA: Insufficient documentation

## 2012-12-08 DIAGNOSIS — I10 Essential (primary) hypertension: Secondary | ICD-10-CM | POA: Insufficient documentation

## 2012-12-08 DIAGNOSIS — M419 Scoliosis, unspecified: Secondary | ICD-10-CM | POA: Insufficient documentation

## 2012-12-08 DIAGNOSIS — I951 Orthostatic hypotension: Secondary | ICD-10-CM | POA: Insufficient documentation

## 2012-12-08 DIAGNOSIS — M1990 Unspecified osteoarthritis, unspecified site: Secondary | ICD-10-CM | POA: Insufficient documentation

## 2012-12-12 ENCOUNTER — Telehealth: Payer: Self-pay | Admitting: Neurology

## 2012-12-12 NOTE — Telephone Encounter (Signed)
Blood work ordered on 09/27/2012 was apparently finally done through her primary care physician looking for etiologies of double vision and ptosis. The B12 level, thyroid profile, and a ACH receptor antibody levels were unremarkable.

## 2012-12-13 ENCOUNTER — Ambulatory Visit (INDEPENDENT_AMBULATORY_CARE_PROVIDER_SITE_OTHER): Payer: Medicare Other | Admitting: Interventional Cardiology

## 2012-12-13 ENCOUNTER — Encounter: Payer: Self-pay | Admitting: Interventional Cardiology

## 2012-12-13 VITALS — BP 131/89 | HR 80 | Wt 127.0 lb

## 2012-12-13 DIAGNOSIS — R609 Edema, unspecified: Secondary | ICD-10-CM

## 2012-12-13 DIAGNOSIS — I4891 Unspecified atrial fibrillation: Secondary | ICD-10-CM

## 2012-12-13 NOTE — Patient Instructions (Signed)
Your physician wants you to follow-up in: 1 year with Dr. Varanasi. You will receive a reminder letter in the mail two months in advance. If you don't receive a letter, please call our office to schedule the follow-up appointment.  Your physician recommends that you continue on your current medications as directed. Please refer to the Current Medication list given to you today.  

## 2012-12-13 NOTE — Progress Notes (Signed)
Patient ID: Leslie Stevens, female   DOB: December 29, 1927, 79 y.o.   MRN: [id number]    [address] [place], [place]  [address] Phone: [phone] Fax:  [phone]  Date:  12/13/2012   ID:  Leslie Stevens, DOB [birth year], MRN [id number]  PCP:  Lolita Patella, MD      History of Present Illness: Leslie Stevens is a 79 y.o. female  with h/o AFib. Swelling has improved. Double vision, somewhat helped with glasses in with tip from ophthalmologist regarding cataract lens implants.  Atrial Fibrillation F/U:  c/o Leg edema improving.  Denies : Chest pain.  Dizziness.  Orthopnea.  Palpitations.  Syncope.     Wt Readings from Last 3 Encounters:  12/13/12 127 lb (57.607 kg)  05/05/12 140 lb (63.504 kg)     Past Medical History  Diagnosis Date  . Parkinson's disease   . Hypertension   . Atrial fibrillation   . Breast cancer   . Degenerative arthritis   . Diplopia 09/27/2012  . Scoliosis   . Small bowel obstruction  06/2007  . Asthma   . Hypopotassemia   . Orthostatic hypotension   . GERD (gastroesophageal reflux disease)     Current Outpatient Prescriptions  Medication Sig Dispense Refill  . albuterol (PROVENTIL HFA;VENTOLIN HFA) 108 (90 BASE) MCG/ACT inhaler Inhale 2 puffs into the lungs every 6 (six) hours as needed for wheezing.      Marland Kitchen b complex-C-folic acid 1 MG capsule Take 1 capsule by mouth daily.      . Calcium Citrate-Vitamin D 250-50 MG-UNIT TABS Take 500 Units by mouth daily.      . Carbidopa-Levodopa ER (SINEMET CR) 25-100 MG tablet controlled release Take 0.5 tablets by mouth 3 (three) times daily.       . digoxin (LANOXIN) 0.125 MG tablet Take 0.125 mg by mouth daily.      Marland Kitchen docusate sodium (COLACE) 100 MG capsule Take 100 mg by mouth daily.      Marland Kitchen Kelp 225 MCG TABS Take 1 tablet by mouth daily.      Marland Kitchen lidocaine (LIDODERM) 5 % Place 1 patch onto the skin daily as needed. Remove & Discard patch within 12 hours or as directed by MD      .  loratadine (CLARITIN) 10 MG tablet Take 10 mg by mouth as needed.       . mirabegron ER (MYRBETRIQ) 50 MG TB24 Take 50 mg by mouth daily.      . polyethylene glycol (MIRALAX / GLYCOLAX) packet Take 17 g by mouth daily as needed.       . Potassium Gluconate 595 MG CAPS Take 1 capsule by mouth daily.       . propranolol (INDERAL) 40 MG tablet Take 40 mg by mouth 2 (two) times daily.      . solifenacin (VESICARE) 5 MG tablet Take 2.5 mg by mouth 2 (two) times daily. 1/2 tablet daily      . traMADol (ULTRAM) 50 MG tablet Take 50 mg by mouth every 6 (six) hours as needed for pain.      . vitamin E 400 UNIT capsule Take 400 Units by mouth daily.       No current facility-administered medications for this visit.    Allergies:   No Known Allergies  Social History:  The patient  reports that she quit smoking about 22 years ago. She does not have any smokeless tobacco history on file. She reports that  she drinks alcohol. She reports that she does not use illicit drugs.   Family History:  The patient's family history includes Breast cancer in her mother; Emphysema in her father; Obesity in her sister.   ROS:  Please see the history of present illness.  No nausea, vomiting.  No fevers, chills.  No focal weakness.  No dysuria.    All other systems reviewed and negative.   PHYSICAL EXAM: VS:  BP 131/89  Wt 127 lb (57.607 kg)  BMI 23.61 kg/m2 Well nourished, well developed, in no acute distress HEENT: normal Neck: no JVD, no carotid bruits Cardiac:  normal S1, S2; Irregularly irregulary Lungs:  clear to auscultation bilaterally, no wheezing, rhonchi or rales Abd: soft, nontender, no hepatomegaly Ext: Trace bilateral lower extremity edema Skin: warm and dry Neuro:   no focal abnormalities noted  EKG:  AFib, rate controlled, NSST   ASSESSMENT AND PLAN:  Atrial fibrillation  Continue Metoprolol Succinate Tablet Extended Release 24 Hour, 50 MG, 1 tablet, Orally, Once a day, 30 day(s), 30, Refills  11 Continue Digoxin Tablet, 0.125 MG, 1 tablet, Orally, Once a day, 30 day(s), 30 Diagnostic Imaging:EKG Harward,Amy 12/11/2011 11:40:57 AM > Ade Stmarie,JAY 12/11/2011 12:06:39 PM > AFib, rate controlled  Adequate rate control. Not a good coumadin candidate. Stopped digoxin for 3 days to see if double vision imporoved. Digoxin was restarted because she felt much worse off of the medicine. 2. Edema of legs  Resolved with stockings. Can elevate legs as well at night to help with edema.     Signed, Fredric Mare, MD, [facility] 12/13/2012 3:13 PM

## 2013-03-16 ENCOUNTER — Other Ambulatory Visit: Payer: Self-pay | Admitting: *Deleted

## 2013-03-16 ENCOUNTER — Telehealth: Payer: Self-pay | Admitting: *Deleted

## 2013-03-16 MED ORDER — PROPRANOLOL HCL 40 MG PO TABS: 40.0000 mg | ORAL_TABLET | Freq: Two times a day (BID) | ORAL | Status: AC

## 2013-03-16 NOTE — Telephone Encounter (Signed)
To Dr. Varanasi, please advise.  

## 2013-03-16 NOTE — Telephone Encounter (Signed)
Patients son called and wanted to know if it was ok for him to give patient an extra half of her digoxin. He stated that a few days ago she became lethargic and had some lower extremity edema and he gave her an extra half and within an hour she was ok. Please advise if a change in dose is ok. Thanks, MI

## 2013-03-22 NOTE — Telephone Encounter (Signed)
Pts son notified of below. He would like to know what he should do if pt has severe lethargy and fluid retention?

## 2013-03-22 NOTE — Telephone Encounter (Signed)
On 08/06/11 digoxin level was < 0.2

## 2013-03-22 NOTE — Telephone Encounter (Signed)
Would not change does chronically as too much Digoxin can cause problems.  What was her last Digoxina level.

## 2013-04-01 NOTE — Telephone Encounter (Signed)
Could consider furosemide 20 mg daily prn fluid retention

## 2013-04-03 MED ORDER — FUROSEMIDE 20 MG PO TABS: ORAL_TABLET | ORAL | Status: DC

## 2013-04-03 NOTE — Addendum Note (Signed)
Addended byUlla Potash H on: 04/03/2013 04:01 PM   Modules accepted: Orders

## 2013-04-03 NOTE — Addendum Note (Signed)
Addended byUlla Potash H on: 04/03/2013 03:59 PM   Modules accepted: Orders

## 2013-04-03 NOTE — Telephone Encounter (Signed)
Pt son notified. Rx for lasix 20 mg sent he. He is willing to give the lasix a try as needed. FYI to Dr. Irish Lack.

## 2013-04-18 ENCOUNTER — Other Ambulatory Visit: Payer: Self-pay

## 2013-04-18 ENCOUNTER — Ambulatory Visit: Payer: Medicare Other | Admitting: Neurology

## 2013-04-18 MED ORDER — DIGOXIN 125 MCG PO TABS: 0.1250 mg | ORAL_TABLET | Freq: Every day | ORAL | Status: DC

## 2013-05-05 ENCOUNTER — Other Ambulatory Visit: Payer: Self-pay

## 2013-05-05 MED ORDER — CARBIDOPA-LEVODOPA ER 25-100 MG PO TBCR: EXTENDED_RELEASE_TABLET | ORAL | Status: DC

## 2013-06-01 ENCOUNTER — Encounter (HOSPITAL_COMMUNITY): Payer: Self-pay | Admitting: Emergency Medicine

## 2013-06-01 ENCOUNTER — Emergency Department (HOSPITAL_COMMUNITY): Payer: Medicare Other

## 2013-06-01 ENCOUNTER — Inpatient Hospital Stay (HOSPITAL_COMMUNITY)
Admission: EM | Admit: 2013-06-01 | Discharge: 2013-06-30 | DRG: 871 | Disposition: E | Payer: Medicare Other | Attending: Internal Medicine | Admitting: Internal Medicine

## 2013-06-01 DIAGNOSIS — G20 Parkinson's disease: Secondary | ICD-10-CM | POA: Diagnosis present

## 2013-06-01 DIAGNOSIS — J45909 Unspecified asthma, uncomplicated: Secondary | ICD-10-CM | POA: Diagnosis present

## 2013-06-01 DIAGNOSIS — F0280 Dementia in other diseases classified elsewhere without behavioral disturbance: Secondary | ICD-10-CM | POA: Diagnosis present

## 2013-06-01 DIAGNOSIS — J9601 Acute respiratory failure with hypoxia: Secondary | ICD-10-CM | POA: Diagnosis present

## 2013-06-01 DIAGNOSIS — Z803 Family history of malignant neoplasm of breast: Secondary | ICD-10-CM

## 2013-06-01 DIAGNOSIS — Z66 Do not resuscitate: Secondary | ICD-10-CM | POA: Diagnosis present

## 2013-06-01 DIAGNOSIS — Z96649 Presence of unspecified artificial hip joint: Secondary | ICD-10-CM

## 2013-06-01 DIAGNOSIS — I4891 Unspecified atrial fibrillation: Secondary | ICD-10-CM | POA: Diagnosis present

## 2013-06-01 DIAGNOSIS — I10 Essential (primary) hypertension: Secondary | ICD-10-CM | POA: Diagnosis present

## 2013-06-01 DIAGNOSIS — R6520 Severe sepsis without septic shock: Secondary | ICD-10-CM

## 2013-06-01 DIAGNOSIS — E871 Hypo-osmolality and hyponatremia: Secondary | ICD-10-CM | POA: Diagnosis present

## 2013-06-01 DIAGNOSIS — R1313 Dysphagia, pharyngeal phase: Secondary | ICD-10-CM | POA: Diagnosis present

## 2013-06-01 DIAGNOSIS — J189 Pneumonia, unspecified organism: Secondary | ICD-10-CM | POA: Diagnosis present

## 2013-06-01 DIAGNOSIS — Z9849 Cataract extraction status, unspecified eye: Secondary | ICD-10-CM

## 2013-06-01 DIAGNOSIS — Z515 Encounter for palliative care: Secondary | ICD-10-CM

## 2013-06-01 DIAGNOSIS — M1990 Unspecified osteoarthritis, unspecified site: Secondary | ICD-10-CM | POA: Diagnosis present

## 2013-06-01 DIAGNOSIS — G9341 Metabolic encephalopathy: Secondary | ICD-10-CM | POA: Diagnosis present

## 2013-06-01 DIAGNOSIS — J9600 Acute respiratory failure, unspecified whether with hypoxia or hypercapnia: Secondary | ICD-10-CM | POA: Diagnosis present

## 2013-06-01 DIAGNOSIS — G20A1 Parkinson's disease without dyskinesia, without mention of fluctuations: Secondary | ICD-10-CM | POA: Diagnosis present

## 2013-06-01 DIAGNOSIS — Z853 Personal history of malignant neoplasm of breast: Secondary | ICD-10-CM

## 2013-06-01 DIAGNOSIS — R1311 Dysphagia, oral phase: Secondary | ICD-10-CM | POA: Diagnosis present

## 2013-06-01 DIAGNOSIS — M4120 Other idiopathic scoliosis, site unspecified: Secondary | ICD-10-CM | POA: Diagnosis present

## 2013-06-01 DIAGNOSIS — A419 Sepsis, unspecified organism: Principal | ICD-10-CM | POA: Diagnosis present

## 2013-06-01 DIAGNOSIS — J690 Pneumonitis due to inhalation of food and vomit: Secondary | ICD-10-CM

## 2013-06-01 DIAGNOSIS — Z79899 Other long term (current) drug therapy: Secondary | ICD-10-CM

## 2013-06-01 DIAGNOSIS — Z87891 Personal history of nicotine dependence: Secondary | ICD-10-CM

## 2013-06-01 DIAGNOSIS — K219 Gastro-esophageal reflux disease without esophagitis: Secondary | ICD-10-CM | POA: Diagnosis present

## 2013-06-01 LAB — COMPREHENSIVE METABOLIC PANEL
ALBUMIN: 3.1 g/dL — AB (ref 3.5–5.2)
ALT: 11 U/L (ref 0–35)
AST: 31 U/L (ref 0–37)
Alkaline Phosphatase: 117 U/L (ref 39–117)
BUN: 16 mg/dL (ref 6–23)
CO2: 25 mEq/L (ref 19–32)
CREATININE: 0.49 mg/dL — AB (ref 0.50–1.10)
Calcium: 9.1 mg/dL (ref 8.4–10.5)
Chloride: 94 mEq/L — ABNORMAL LOW (ref 96–112)
GFR calc Af Amer: >90 mL/min (ref >90)
GFR, EST NON AFRICAN AMERICAN: 88 mL/min — AB (ref >90)
Glucose, Bld: 105 mg/dL — ABNORMAL HIGH (ref 70–99)
Potassium: 4.4 mEq/L (ref 3.7–5.3)
Sodium: 135 mEq/L — ABNORMAL LOW (ref 137–147)
Total Bilirubin: 0.5 mg/dL (ref 0.3–1.2)
Total Protein: 6.8 g/dL (ref 6.0–8.3)

## 2013-06-01 LAB — URINALYSIS, ROUTINE W REFLEX MICROSCOPIC
Glucose, UA: NEGATIVE mg/dL
Hgb urine dipstick: NEGATIVE
Ketones, ur: 40 mg/dL — AB
Leukocytes, UA: NEGATIVE
Nitrite: NEGATIVE
Protein, ur: 30 mg/dL — AB
Specific Gravity, Urine: 1.03 (ref 1.005–1.030)
Urobilinogen, UA: 0.2 mg/dL (ref 0.0–1.0)
pH: 5.5 (ref 5.0–8.0)

## 2013-06-01 LAB — CBC WITH DIFFERENTIAL/PLATELET
Basophils Absolute: 0 K/uL (ref 0.0–0.1)
Basophils Relative: 0 % (ref 0–1)
Eosinophils Absolute: 0 K/uL (ref 0.0–0.7)
Eosinophils Relative: 0 % (ref 0–5)
HCT: 41.3 % (ref 36.0–46.0)
Hemoglobin: 14.3 g/dL (ref 12.0–15.0)
Lymphocytes Relative: 3 % — ABNORMAL LOW (ref 12–46)
Lymphs Abs: 0.9 K/uL (ref 0.7–4.0)
MCH: 31.9 pg (ref 26.0–34.0)
MCHC: 34.6 g/dL (ref 30.0–36.0)
MCV: 92.2 fL (ref 78.0–100.0)
Monocytes Absolute: 0.6 K/uL (ref 0.1–1.0)
Monocytes Relative: 2 % — ABNORMAL LOW (ref 3–12)
Neutro Abs: 28.9 K/uL — ABNORMAL HIGH (ref 1.7–7.7)
Neutrophils Relative %: 95 % — ABNORMAL HIGH (ref 43–77)
Platelets: 269 K/uL (ref 150–400)
RBC: 4.48 MIL/uL (ref 3.87–5.11)
RDW: 13.6 % (ref 11.5–15.5)
WBC: 30.4 K/uL — ABNORMAL HIGH (ref 4.0–10.5)

## 2013-06-01 LAB — I-STAT TROPONIN, ED: Troponin i, poc: 0.04 ng/mL (ref 0.00–0.08)

## 2013-06-01 LAB — I-STAT CG4 LACTIC ACID, ED: Lactic Acid, Venous: 1.77 mmol/L (ref 0.5–2.2)

## 2013-06-01 LAB — URINE MICROSCOPIC-ADD ON

## 2013-06-01 LAB — DIGOXIN LEVEL: Digoxin Level: 0.7 ng/mL — ABNORMAL LOW (ref 0.8–2.0)

## 2013-06-01 MED ORDER — VANCOMYCIN HCL 500 MG IV SOLR
500.0000 mg | Freq: Two times a day (BID) | INTRAVENOUS | Status: DC
  Administered 2013-06-02: 500 mg via INTRAVENOUS
  Filled 2013-06-01 (×3): qty 500

## 2013-06-01 MED ORDER — SODIUM CHLORIDE 0.9 % IV SOLN
1000.0000 mL | Freq: Once | INTRAVENOUS | Status: AC
  Administered 2013-06-01: 1000 mL via INTRAVENOUS

## 2013-06-01 MED ORDER — IOHEXOL 350 MG/ML SOLN
100.0000 mL | Freq: Once | INTRAVENOUS | Status: AC | PRN
  Administered 2013-06-01: 100 mL via INTRAVENOUS

## 2013-06-01 MED ORDER — LORATADINE 10 MG PO TABS
10.0000 mg | ORAL_TABLET | Freq: Every day | ORAL | Status: DC
  Administered 2013-06-02: 10 mg via ORAL
  Filled 2013-06-01 (×4): qty 1

## 2013-06-01 MED ORDER — IPRATROPIUM-ALBUTEROL 0.5-2.5 (3) MG/3ML IN SOLN
3.0000 mL | RESPIRATORY_TRACT | Status: DC | PRN
  Administered 2013-06-02: 3 mL via RESPIRATORY_TRACT
  Filled 2013-06-01: qty 3

## 2013-06-01 MED ORDER — DOCUSATE SODIUM 100 MG PO CAPS
100.0000 mg | ORAL_CAPSULE | Freq: Every day | ORAL | Status: DC
  Administered 2013-06-02 – 2013-06-03 (×3): 100 mg via ORAL
  Filled 2013-06-01 (×5): qty 1

## 2013-06-01 MED ORDER — PROPRANOLOL HCL 40 MG PO TABS
40.0000 mg | ORAL_TABLET | Freq: Two times a day (BID) | ORAL | Status: DC
  Administered 2013-06-02 – 2013-06-03 (×4): 40 mg via ORAL
  Filled 2013-06-01 (×9): qty 1

## 2013-06-01 MED ORDER — TRAMADOL HCL 50 MG PO TABS: 25.0000 mg | ORAL_TABLET | Freq: Every day | ORAL | Status: DC | PRN

## 2013-06-01 MED ORDER — ALBUTEROL SULFATE HFA 108 (90 BASE) MCG/ACT IN AERS: 2.0000 | INHALATION_SPRAY | Freq: Four times a day (QID) | RESPIRATORY_TRACT | Status: DC | PRN

## 2013-06-01 MED ORDER — PIPERACILLIN-TAZOBACTAM 3.375 G IVPB 30 MIN
3.3750 g | Freq: Once | INTRAVENOUS | Status: AC
  Administered 2013-06-01: 3.375 g via INTRAVENOUS
  Filled 2013-06-01: qty 50

## 2013-06-01 MED ORDER — ACETAMINOPHEN 325 MG PO TABS: 650.0000 mg | ORAL_TABLET | Freq: Every day | ORAL | Status: DC | PRN

## 2013-06-01 MED ORDER — SODIUM CHLORIDE 0.9 % IV SOLN
1.5000 g | Freq: Four times a day (QID) | INTRAVENOUS | Status: DC
  Administered 2013-06-01 – 2013-06-05 (×15): 1.5 g via INTRAVENOUS
  Filled 2013-06-01 (×19): qty 1.5

## 2013-06-01 MED ORDER — MIRABEGRON ER 50 MG PO TB24
50.0000 mg | ORAL_TABLET | Freq: Every day | ORAL | Status: DC
  Administered 2013-06-02: 50 mg via ORAL
  Filled 2013-06-01 (×4): qty 1

## 2013-06-01 MED ORDER — VANCOMYCIN HCL IN DEXTROSE 1-5 GM/200ML-% IV SOLN
1000.0000 mg | INTRAVENOUS | Status: AC
  Administered 2013-06-01: 1000 mg via INTRAVENOUS
  Filled 2013-06-01: qty 200

## 2013-06-01 MED ORDER — OSELTAMIVIR PHOSPHATE 30 MG PO CAPS
30.0000 mg | ORAL_CAPSULE | Freq: Two times a day (BID) | ORAL | Status: DC
  Administered 2013-06-02: 30 mg via ORAL
  Filled 2013-06-01 (×2): qty 1

## 2013-06-01 MED ORDER — SODIUM CHLORIDE 0.9 % IV SOLN: 1000.0000 mL | INTRAVENOUS | Status: DC

## 2013-06-01 MED ORDER — OSELTAMIVIR PHOSPHATE 75 MG PO CAPS
75.0000 mg | ORAL_CAPSULE | Freq: Once | ORAL | Status: AC
  Administered 2013-06-01: 75 mg via ORAL
  Filled 2013-06-01: qty 1

## 2013-06-01 MED ORDER — MOMETASONE FURO-FORMOTEROL FUM 100-5 MCG/ACT IN AERO
2.0000 | INHALATION_SPRAY | Freq: Two times a day (BID) | RESPIRATORY_TRACT | Status: DC
  Administered 2013-06-02 – 2013-06-03 (×4): 2 via RESPIRATORY_TRACT
  Filled 2013-06-01: qty 8.8

## 2013-06-01 MED ORDER — GUAIFENESIN ER 600 MG PO TB12
600.0000 mg | ORAL_TABLET | Freq: Two times a day (BID) | ORAL | Status: DC
  Administered 2013-06-02 – 2013-06-03 (×4): 600 mg via ORAL
  Filled 2013-06-01 (×9): qty 1

## 2013-06-01 MED ORDER — POTASSIUM GLUCONATE 595 MG PO CAPS: 595.0000 mg | ORAL_CAPSULE | Freq: Every day | ORAL | Status: DC

## 2013-06-01 MED ORDER — HEPARIN SODIUM (PORCINE) 5000 UNIT/ML IJ SOLN
5000.0000 [IU] | Freq: Three times a day (TID) | INTRAMUSCULAR | Status: DC
  Administered 2013-06-02 – 2013-06-05 (×11): 5000 [IU] via SUBCUTANEOUS
  Filled 2013-06-01 (×13): qty 1

## 2013-06-01 MED ORDER — CARBIDOPA-LEVODOPA 25-100 MG PO TABS
0.5000 | ORAL_TABLET | Freq: Three times a day (TID) | ORAL | Status: DC
  Administered 2013-06-02 (×3): 0.5 via ORAL
  Filled 2013-06-01 (×13): qty 0.5

## 2013-06-01 MED ORDER — DARIFENACIN HYDROBROMIDE ER 7.5 MG PO TB24
7.5000 mg | ORAL_TABLET | Freq: Every day | ORAL | Status: DC
  Administered 2013-06-02: 7.5 mg via ORAL
  Filled 2013-06-01 (×4): qty 1

## 2013-06-01 MED ORDER — SODIUM CHLORIDE 0.9 % IV SOLN: INTRAVENOUS | Status: DC

## 2013-06-01 MED ORDER — DIGOXIN 125 MCG PO TABS
0.1250 mg | ORAL_TABLET | Freq: Every day | ORAL | Status: DC
  Administered 2013-06-02 – 2013-06-03 (×3): 0.125 mg via ORAL
  Filled 2013-06-01 (×5): qty 1

## 2013-06-01 NOTE — ED Notes (Signed)
Attempted report 

## 2013-06-01 NOTE — ED Notes (Signed)
Pt in from home via EMS c/o cough and shortness of breath, upon EMS arrival patient was lethargic and O2 on RA was 74 %, placed on NRB and O2 level improved to 95%, pt became alert and oriented per her normal, remains on NRB upon arrival, IV in 20g in LFA started en route.

## 2013-06-01 NOTE — ED Notes (Signed)
Swelling noted in patient left arm above patient IV site, fluids stopped, unsure if IV is infiltrated, will hold fluids and monitor, IV called for IV start for acceptable size and location for chest CT.

## 2013-06-01 NOTE — H&P (Signed)
[facility] History and Physical  Leslie Stevens JKK:[id number] DOB: 1927-11-05 DOA: June 08, 2013  Referring physician: EDP PCP: Vena Austria, MD   Chief Complaint: SOB   HPI: Leslie Stevens is a 80 y.o. female who presents to the ED in respiratory distress.  SOB has been worsening over the past 3 days.  Son had "flu" 1 week ago.  Patient began to develop URI symptoms for last 3 days.  Today in ED desatting to 74% initially, put on NRB with SOB improving and sats now at baseline.  Review of Systems: Denies aspiration, cough after eating, choking, Systems reviewed.  As above, otherwise negative  Past Medical History  Diagnosis Date  . Parkinson's disease   . Hypertension   . Atrial fibrillation   . Breast cancer   . Degenerative arthritis   . Diplopia 09/27/2012  . Scoliosis   . Small bowel obstruction  06/2007  . Asthma   . Hypopotassemia   . Orthostatic hypotension   . GERD (gastroesophageal reflux disease)    Past Surgical History  Procedure Laterality Date  . Mastectomy    . Total hip arthroplasty Right   . Lumbar spine surgery    . Tonsillectomy    . Cataract extraction Bilateral    Social History:  reports that she quit smoking about 23 years ago. She does not have any smokeless tobacco history on file. She reports that she drinks alcohol. She reports that she does not use illicit drugs.  No Known Allergies  Family History  Problem Relation Age of Onset  . Breast cancer Mother   . Emphysema Father   . Obesity Sister      Prior to Admission medications   Medication Sig Start Date End Date Taking? Authorizing Provider  acetaminophen (TYLENOL) 325 MG tablet Take 650 mg by mouth daily as needed (pain).   Yes Historical Provider, MD  albuterol (PROVENTIL HFA;VENTOLIN HFA) 108 (90 BASE) MCG/ACT inhaler Inhale 2 puffs into the lungs every 6 (six) hours as needed for wheezing or shortness of breath.    Yes Historical Provider, MD  b complex-C-folic acid 1  MG capsule Take 1 capsule by mouth daily.   Yes Historical Provider, MD  CALCIUM-MAGNESIUM-VITAMIN D PO Take 0.5 tablets by mouth daily. 1 tablet:  Calcium citrate 500 mg/ magnesium 80 mg/ vitamin D 250 I.U.   Yes Historical Provider, MD  carbidopa-levodopa (SINEMET IR) 25-100 MG per tablet Take 0.5 tablets by mouth 3 (three) times daily.   Yes Historical Provider, MD  cetirizine (ZYRTEC) 10 MG tablet Take 10 mg by mouth daily as needed for allergies.   Yes Historical Provider, MD  Chlorphen-Phenyleph-ASA (ALKA-SELTZER PLUS COLD PO) Take 1 tablet by mouth 2 (two) times daily as needed (flu symptoms).   Yes Historical Provider, MD  digoxin (LANOXIN) 0.125 MG tablet Take 0.125 mg by mouth at bedtime.   Yes Historical Provider, MD  diphenhydrAMINE (BENADRYL) 25 MG tablet Take 25 mg by mouth at bedtime as needed for allergies.   Yes Historical Provider, MD  docusate sodium (COLACE) 100 MG capsule Take 100 mg by mouth at bedtime.    Yes Historical Provider, MD  Fluticasone-Salmeterol (ADVAIR) 250-50 MCG/DOSE AEPB Inhale 1 puff into the lungs at bedtime.   Yes Historical Provider, MD  lidocaine (LIDODERM) 5 % Place 1 patch onto the skin daily as needed (pain (shoulder blades or lumbar). Remove & Discard patch within 12 hours or as directed by MD   Yes Historical Provider, MD  loratadine (  CLARITIN) 10 MG tablet Take 10 mg by mouth daily as needed for allergies.    Yes Historical Provider, MD  mirabegron ER (MYRBETRIQ) 50 MG TB24 Take 50 mg by mouth daily.   Yes Historical Provider, MD  Potassium Gluconate 595 MG CAPS Take 595 mg by mouth daily.    Yes Historical Provider, MD  propranolol (INDERAL) 40 MG tablet Take 1 tablet (40 mg total) by mouth 2 (two) times daily. 03/16/13  Yes Jettie Booze, MD  solifenacin (VESICARE) 5 MG tablet Take 2.5 mg by mouth 2 (two) times daily. Mid -day (noon) and at bedtime   Yes Historical Provider, MD  traMADol (ULTRAM) 50 MG tablet Take 25 mg by mouth daily as needed  (pain).    Yes Historical Provider, MD  Turmeric 500 MG CAPS Take 500 mg by mouth daily.   Yes Historical Provider, MD  vitamin E 400 UNIT capsule Take 400 Units by mouth daily.   Yes Historical Provider, MD   Physical Exam: Filed Vitals:   06/10/2013 2200  BP: 132/77  Pulse:   Temp:   Resp: 20    BP 132/77  Pulse 85  Temp(Src) 99.4 F (37.4 C)  Resp 20  SpO2 100%  General Appearance:    Alert, oriented, no distress, appears stated age  Head:    Normocephalic, atraumatic  Eyes:    PERRL, EOMI, sclera non-icteric        Nose:   Nares without drainage or epistaxis. Mucosa, turbinates normal  Throat:   Moist mucous membranes. Oropharynx without erythema or exudate.  Neck:   Supple. No carotid bruits.  No thyromegaly.  No lymphadenopathy.   Back:     No CVA tenderness, no spinal tenderness  Lungs:     Coarse breath sounds bilaterally.  Chest wall:    No tenderness to palpitation  Heart:    Regular rate and rhythm without murmurs, gallops, rubs  Abdomen:     Soft, non-tender, nondistended, normal bowel sounds, no organomegaly  Genitalia:    deferred  Rectal:    deferred  Extremities:   No clubbing, cyanosis or edema.  Pulses:   2+ and symmetric all extremities  Skin:   Skin color, texture, turgor normal, no rashes or lesions  Lymph nodes:   Cervical, supraclavicular, and axillary nodes normal  Neurologic:   CNII-XII intact. Normal strength, sensation and reflexes      throughout    Labs on Admission:  Basic Metabolic Panel:  Recent Labs Lab 06/12/2013 1721  NA 135*  K 4.4  CL 94*  CO2 25  GLUCOSE 105*  BUN 16  CREATININE 0.49*  CALCIUM 9.1   Liver Function Tests:  Recent Labs Lab 06/27/2013 1721  AST 31  ALT 11  ALKPHOS 117  BILITOT 0.5  PROT 6.8  ALBUMIN 3.1*   No results found for this basename: LIPASE, AMYLASE,  in the last 168 hours No results found for this basename: AMMONIA,  in the last 168 hours CBC:  Recent Labs Lab 06/26/2013 1721  WBC 30.4*   NEUTROABS 28.9*  HGB 14.3  HCT 41.3  MCV 92.2  PLT 269   Cardiac Enzymes: No results found for this basename: CKTOTAL, CKMB, CKMBINDEX, TROPONINI,  in the last 168 hours  BNP (last 3 results) No results found for this basename: PROBNP,  in the last 8760 hours CBG: No results found for this basename: GLUCAP,  in the last 168 hours  Radiological Exams on Admission: Ct Angio Chest W/cm &/  or Wo Cm  06/17/2013   CLINICAL DATA:  Shortness of breath and cough.  EXAM: CT ANGIOGRAPHY CHEST WITH CONTRAST  TECHNIQUE: Multidetector CT imaging of the chest was performed using the standard protocol during bolus administration of intravenous contrast. Multiplanar CT image reconstructions and MIPs were obtained to evaluate the vascular anatomy.  CONTRAST:  160mL OMNIPAQUE IOHEXOL 350 MG/ML SOLN  COMPARISON:  Chest radiograph performed earlier today at 6:53 p.m.  FINDINGS: There is no evidence of pulmonary embolus.  Dense airspace opacification is noted involving the inferior aspect of the right upper lobe and portions of the left lower lobe. Additional scattered airspace opacities are seen within the right lower lobe, and scattered nonspecific peripheral nodules are seen within the right upper and middle lung lobes. There is a significant amount of fluid seen filling the bronchus and bronchioles to the left lower lobe, compatible with aspiration. Trace bilateral pleural effusions are seen. Minimal underlying emphysematous change is noted at the upper lung lobes. There is no evidence of pneumothorax.  A 1.6 cm node is seen at the subcarinal region. Scattered coronary artery calcifications are seen. There is mild biatrial enlargement. No pericardial effusion is identified. The great vessels are grossly unremarkable in appearance. No axillary lymphadenopathy is seen. A 2.3 cm hypodensity is noted at the right thyroid lobe.  The visualized portions of the liver and spleen are unremarkable. The patient is status post  left-sided mastectomy.  No acute osseous abnormalities are seen. There is chronic grade 2 anterolisthesis of C6 on C7, with associated degenerative change along the cervical spine.  Review of the MIP images confirms the above findings.  IMPRESSION: 1. No evidence for pulmonary embolus. 2. Dense airspace opacification involving the inferior aspect of the right upper lobe and portions of the left lower lobe, compatible with significant pneumonia. Significant amount of fluid seen filling the bronchus and bronchioles to the left lower lobe, compatible with aspiration. 3. Additional scattered airspace opacities in the right lower lobe, and nonspecific peripheral nodules in the right upper and middle lung lobes. These are most likely infectious in nature, though would consider follow-up CT of the chest after completion of treatment for pneumonia, to ensure resolution of pulmonary nodules, as deemed clinically appropriate. 4. Trace bilateral pleural effusion seen. 5. Minimal emphysematous change noted at the upper lung lobes. 6. Scattered coronary artery calcifications seen. 7. 1.6 cm node at the subcarinal region may reflect the acute infectious process. 8. Mild biatrial enlargement noted. 9. 2.3 cm hypodensity at the right thyroid lobe. Consider further evaluation with thyroid ultrasound. If patient is clinically hyperthyroid, consider nuclear medicine thyroid uptake and scan.  These results were called by telephone at the time of interpretation on 06/16/2013 at 9:49 PM to Dr. Sol Passer , who verbally acknowledged these results.   Electronically Signed   By: Garald Balding M.D.   On: 06/26/2013 21:49   Dg Chest Port 1 View  06/20/2013   CLINICAL DATA:  Fever, body ache  EXAM: PORTABLE CHEST - 1 VIEW  COMPARISON:  None.  FINDINGS: Cardiomegaly. Mild thoracic dextroscoliosis. Hyperinflation again noted. Bilateral apical pleural parenchymal scarring. There is chronic interstitial prominence. Central mild bronchitic  changes with worsening from prior exam. Mild left basilar atelectasis. No segmental infiltrate or pulmonary edema.  IMPRESSION: Hyperinflation. Bilateral apical pleural parenchymal scarring. Central bronchitic changes with slight worsening from prior exam. No segmental infiltrate.   Electronically Signed   By: Lahoma Crocker M.D.   On: 06/05/2013 19:11  EKG: Independently reviewed.  Assessment/Plan Principal Problem:   Aspiration pneumonia Active Problems:   Pneumonia   1. Aspiration PNA with sepsis - given appearance on CT scan, will cover as aspiration PNA with unasyn and vanc.  Patient to SDU.  Cultures pending, RRT consulted for pulmonary toilet.  Tamiflu given report of "flu" in house but doubt influenza as primary organism (pcr pending) this late in season.  Follow leukocytosis on repeat CBC in AM.  Gentle hydration with IVF.    Code Status: DNR/DNI "im 14, years old, 89 next month, ive lived long enough" Family Communication: No family in room Disposition Plan: Admit to inpatient SDU   Time spent: 70 min  Vitalia Stough M. [facility] Pager [phone]  If 7AM-7PM, please contact the day team taking care of the patient [email or web address] Password [facility] 06/27/2013, 11:09 PM

## 2013-06-01 NOTE — ED Provider Notes (Signed)
CSN: [id number]     Arrival date & time 06/04/2013  1718 History   First MD Initiated Contact with Patient 06/26/2013 1720     Chief Complaint  Patient presents with  . Shortness of Breath     (Consider location/radiation/quality/duration/timing/severity/associated sxs/prior Treatment) HPI Comments: Was breathing hard at home, found with sats of 74%, put on NRB with SOB improving and sats to baseline. Per patietn, she has been having cough for last 3 days. Her sone had flu 1 week ago and patient has been feeling sick for past 3 days. Pt has DNR/DNI.   Patient is a 80 y.o. female presenting with cough. The history is provided by the patient.  Cough Cough characteristics:  Productive Sputum characteristics:  Clear Severity:  Moderate Onset quality:  Gradual Duration:  3 days Timing:  Constant Progression:  Worsening Chronicity:  New Smoker: yes (former)   Context: upper respiratory infection (pt son was sick with URI 1 week ago, and for past 3 days having coughing, SOB, fevers, denies chest pani, abd pain, n/v/d)   Relieved by:  Nothing Worsened by:  Nothing tried Ineffective treatments:  None tried Associated symptoms: chills, fever and shortness of breath   Associated symptoms: no chest pain, no ear pain, no eye discharge, no rash, no rhinorrhea and no wheezing   Risk factors: recent infection     Past Medical History  Diagnosis Date  . Parkinson's disease   . Hypertension   . Atrial fibrillation   . Breast cancer   . Degenerative arthritis   . Diplopia 09/27/2012  . Scoliosis   . Small bowel obstruction  06/2007  . Asthma   . Hypopotassemia   . Orthostatic hypotension   . GERD (gastroesophageal reflux disease)    Past Surgical History  Procedure Laterality Date  . Mastectomy    . Total hip arthroplasty Right   . Lumbar spine surgery    . Tonsillectomy    . Cataract extraction Bilateral    Family History  Problem Relation Age of Onset  . Breast cancer Mother   .  Emphysema Father   . Obesity Sister    History  Substance Use Topics  . Smoking status: Former Smoker    Quit date: 03/02/1990  . Smokeless tobacco: Not on file  . Alcohol Use: Yes     Comment: Consumes alcohol on occasion   OB History   Grav Para Term Preterm Abortions TAB SAB Ect Mult Living                 Review of Systems  Constitutional: Positive for fever and chills. Negative for activity change and appetite change.  HENT: Positive for congestion. Negative for ear pain and rhinorrhea.   Eyes: Negative for discharge, redness and itching.  Respiratory: Positive for cough and shortness of breath. Negative for wheezing.   Cardiovascular: Negative for chest pain.  Gastrointestinal: Negative for nausea, vomiting and diarrhea.  Genitourinary: Negative for dysuria and hematuria.  Skin: Negative for rash.  Neurological: Negative for syncope and weakness.      Allergies  Review of patient's allergies indicates no known allergies.  Home Medications   Current Outpatient Rx  Name  Route  Sig  Dispense  Refill  . acetaminophen (TYLENOL) 325 MG tablet   Oral   Take 650 mg by mouth daily as needed (pain).         Marland Kitchen albuterol (PROVENTIL HFA;VENTOLIN HFA) 108 (90 BASE) MCG/ACT inhaler   Inhalation   Inhale 2  puffs into the lungs every 6 (six) hours as needed for wheezing or shortness of breath.          Marland Kitchen b complex-C-folic acid 1 MG capsule   Oral   Take 1 capsule by mouth daily.         Marland Kitchen CALCIUM-MAGNESIUM-VITAMIN D PO   Oral   Take 0.5 tablets by mouth daily. 1 tablet:  Calcium citrate 500 mg/ magnesium 80 mg/ vitamin D 250 I.U.         . carbidopa-levodopa (SINEMET IR) 25-100 MG per tablet   Oral   Take 0.5 tablets by mouth 3 (three) times daily.         . cetirizine (ZYRTEC) 10 MG tablet   Oral   Take 10 mg by mouth daily as needed for allergies.         . Chlorphen-Phenyleph-ASA (ALKA-SELTZER PLUS COLD PO)   Oral   Take 1 tablet by mouth 2 (two)  times daily as needed (flu symptoms).         Marland Kitchen digoxin (LANOXIN) 0.125 MG tablet   Oral   Take 0.125 mg by mouth at bedtime.         . diphenhydrAMINE (BENADRYL) 25 MG tablet   Oral   Take 25 mg by mouth at bedtime as needed for allergies.         Marland Kitchen docusate sodium (COLACE) 100 MG capsule   Oral   Take 100 mg by mouth at bedtime.          . Fluticasone-Salmeterol (ADVAIR) 250-50 MCG/DOSE AEPB   Inhalation   Inhale 1 puff into the lungs at bedtime.         . lidocaine (LIDODERM) 5 %   Transdermal   Place 1 patch onto the skin daily as needed (pain (shoulder blades or lumbar). Remove & Discard patch within 12 hours or as directed by MD         . loratadine (CLARITIN) 10 MG tablet   Oral   Take 10 mg by mouth daily as needed for allergies.          . mirabegron ER (MYRBETRIQ) 50 MG TB24   Oral   Take 50 mg by mouth daily.         . Potassium Gluconate 595 MG CAPS   Oral   Take 595 mg by mouth daily.          . propranolol (INDERAL) 40 MG tablet   Oral   Take 1 tablet (40 mg total) by mouth 2 (two) times daily.   180 tablet   3   . solifenacin (VESICARE) 5 MG tablet   Oral   Take 2.5 mg by mouth 2 (two) times daily. Mid -day (noon) and at bedtime         . traMADol (ULTRAM) 50 MG tablet   Oral   Take 25 mg by mouth daily as needed (pain).          . Turmeric 500 MG CAPS   Oral   Take 500 mg by mouth daily.         . vitamin E 400 UNIT capsule   Oral   Take 400 Units by mouth daily.          BP 132/77  Pulse 85  Temp(Src) 99.4 F (37.4 C)  Resp 20  SpO2 100% Physical Exam  Constitutional: She is oriented to person, place, and time. She appears well-developed and well-nourished. She appears distressed (resp  distress).  Moderately dehydrated  HENT:  Head: Normocephalic and atraumatic.  Mouth/Throat: Oropharynx is clear and moist.  Eyes: Conjunctivae and EOM are normal. Pupils are equal, round, and reactive to light. Right eye  exhibits no discharge. Left eye exhibits no discharge. No scleral icterus.  Neck: Normal range of motion. Neck supple.  Cardiovascular: Intact distal pulses.  Exam reveals no gallop and no friction rub.   No murmur heard. 100-110, irreg irreg  Pulmonary/Chest: She is in respiratory distress (20-30x a minute, mildly increased effort, lungs diminished throughout, rhonchi on RLL). She has no wheezes. She has no rales.  Abdominal: Soft. She exhibits no distension and no mass. There is no tenderness.  Musculoskeletal: Normal range of motion.  Neurological: She is alert and oriented to person, place, and time. No cranial nerve deficit. She exhibits normal muscle tone. Coordination normal.  Skin: She is not diaphoretic.    ED Course  Procedures (including critical care time) Labs Review Labs Reviewed  CBC WITH DIFFERENTIAL - Abnormal; Notable for the following:    WBC 30.4 (*)    Neutrophils Relative % 95 (*)    Lymphocytes Relative 3 (*)    Monocytes Relative 2 (*)    Neutro Abs 28.9 (*)    All other components within normal limits  COMPREHENSIVE METABOLIC PANEL - Abnormal; Notable for the following:    Sodium 135 (*)    Chloride 94 (*)    Glucose, Bld 105 (*)    Creatinine, Ser 0.49 (*)    Albumin 3.1 (*)    GFR calc non Af Amer 88 (*)    All other components within normal limits  URINALYSIS, ROUTINE W REFLEX MICROSCOPIC - Abnormal; Notable for the following:    Color, Urine AMBER (*)    Bilirubin Urine SMALL (*)    Ketones, ur 40 (*)    Protein, ur 30 (*)    All other components within normal limits  DIGOXIN LEVEL - Abnormal; Notable for the following:    Digoxin Level 0.7 (*)    All other components within normal limits  URINE MICROSCOPIC-ADD ON - Abnormal; Notable for the following:    Squamous Epithelial / LPF FEW (*)    Bacteria, UA FEW (*)    All other components within normal limits  CULTURE, BLOOD (ROUTINE X 2)  CULTURE, BLOOD (ROUTINE X 2)  URINE CULTURE  INFLUENZA  PANEL BY PCR (TYPE A & B, H1N1)  I-STAT CG4 LACTIC ACID, ED  I-STAT TROPOININ, ED   Imaging Review Ct Angio Chest W/cm &/or Wo Cm  06/20/2013   CLINICAL DATA:  Shortness of breath and cough.  EXAM: CT ANGIOGRAPHY CHEST WITH CONTRAST  TECHNIQUE: Multidetector CT imaging of the chest was performed using the standard protocol during bolus administration of intravenous contrast. Multiplanar CT image reconstructions and MIPs were obtained to evaluate the vascular anatomy.  CONTRAST:  126mL OMNIPAQUE IOHEXOL 350 MG/ML SOLN  COMPARISON:  Chest radiograph performed earlier today at 6:53 p.m.  FINDINGS: There is no evidence of pulmonary embolus.  Dense airspace opacification is noted involving the inferior aspect of the right upper lobe and portions of the left lower lobe. Additional scattered airspace opacities are seen within the right lower lobe, and scattered nonspecific peripheral nodules are seen within the right upper and middle lung lobes. There is a significant amount of fluid seen filling the bronchus and bronchioles to the left lower lobe, compatible with aspiration. Trace bilateral pleural effusions are seen. Minimal underlying emphysematous change is  noted at the upper lung lobes. There is no evidence of pneumothorax.  A 1.6 cm node is seen at the subcarinal region. Scattered coronary artery calcifications are seen. There is mild biatrial enlargement. No pericardial effusion is identified. The great vessels are grossly unremarkable in appearance. No axillary lymphadenopathy is seen. A 2.3 cm hypodensity is noted at the right thyroid lobe.  The visualized portions of the liver and spleen are unremarkable. The patient is status post left-sided mastectomy.  No acute osseous abnormalities are seen. There is chronic grade 2 anterolisthesis of C6 on C7, with associated degenerative change along the cervical spine.  Review of the MIP images confirms the above findings.  IMPRESSION: 1. No evidence for pulmonary  embolus. 2. Dense airspace opacification involving the inferior aspect of the right upper lobe and portions of the left lower lobe, compatible with significant pneumonia. Significant amount of fluid seen filling the bronchus and bronchioles to the left lower lobe, compatible with aspiration. 3. Additional scattered airspace opacities in the right lower lobe, and nonspecific peripheral nodules in the right upper and middle lung lobes. These are most likely infectious in nature, though would consider follow-up CT of the chest after completion of treatment for pneumonia, to ensure resolution of pulmonary nodules, as deemed clinically appropriate. 4. Trace bilateral pleural effusion seen. 5. Minimal emphysematous change noted at the upper lung lobes. 6. Scattered coronary artery calcifications seen. 7. 1.6 cm node at the subcarinal region may reflect the acute infectious process. 8. Mild biatrial enlargement noted. 9. 2.3 cm hypodensity at the right thyroid lobe. Consider further evaluation with thyroid ultrasound. If patient is clinically hyperthyroid, consider nuclear medicine thyroid uptake and scan.  These results were called by telephone at the time of interpretation on 06/16/2013 at 9:49 PM to Dr. Sol Passer , who verbally acknowledged these results.   Electronically Signed   By: Garald Balding M.D.   On: 06/03/2013 21:49   Dg Chest Port 1 View  06/08/2013   CLINICAL DATA:  Fever, body ache  EXAM: PORTABLE CHEST - 1 VIEW  COMPARISON:  None.  FINDINGS: Cardiomegaly. Mild thoracic dextroscoliosis. Hyperinflation again noted. Bilateral apical pleural parenchymal scarring. There is chronic interstitial prominence. Central mild bronchitic changes with worsening from prior exam. Mild left basilar atelectasis. No segmental infiltrate or pulmonary edema.  IMPRESSION: Hyperinflation. Bilateral apical pleural parenchymal scarring. Central bronchitic changes with slight worsening from prior exam. No segmental infiltrate.    Electronically Signed   By: Lahoma Crocker M.D.   On: 06/18/2013 19:11     EKG Interpretation   Date/Time:  Thursday June 01 2013 17:19:38 EDT Ventricular Rate:  102 PR Interval:    QRS Duration: 93 QT Interval:  305 QTC Calculation: 397 R Axis:   90 Text Interpretation:  Atrial fibrillation Borderline right axis deviation  Anteroseptal infarct, old Repol abnrm, severe global ischemia (LM/MVD) TW  depressions new since previous  Confirmed by YAO  MD, DAVID ([id number]) on  06/12/2013 5:24:39 PM      MDM   MDM; 80 y.o. WF w/ PMHx of Parkinsons, HTN, A fib, w/ cc: of cough and SOB for 3 days. Pt exposed to flu, states she has been feeling sick for 3 days with f/c, as well as SOB. EMS called today, initially hypoxic to 70s but returns to normal oxygenation with NRB. On arrival, can give full history, HDS, appears dehydrated. Exam as above. CXR WNL. Large leukocytosis. Given fluid. Pt remained stable in Ed, no increasing o2 requirements.  Given Tamiflu as exposed to flu recently. CTA obtained to r/o PE. Pt with no PE but shows large PNA on CT. Covered with Vanc and Zosyn. Pt remained HDS, admitted to medicine service. Care of case d/w my attending.  Final diagnoses:  None    Admit to Medicine  Sol Passer, MD 06/14/2013 2303

## 2013-06-01 NOTE — Progress Notes (Addendum)
ANTIBIOTIC CONSULT NOTE - INITIAL  Pharmacy Consult for vancomycin Indication: sepsis  No Known Allergies  Patient Measurements: 57.6 kg  Vital Signs: Temp: 99.4 F (37.4 C) (04/02 1721) BP: 143/65 mmHg (04/02 1800) Pulse Rate: 95 (04/02 1800) Intake/Output from previous day:   Intake/Output from this shift:    Labs:  Recent Labs  06/21/2013 1721  WBC 30.4*  HGB 14.3  PLT 269  CREATININE 0.49*   Est CrCl ~48 ml/min The CrCl is unknown because both a height and weight (above a minimum accepted value) are required for this calculation. No results found for this basename: VANCOTROUGH, VANCOPEAK, VANCORANDOM, GENTTROUGH, GENTPEAK, GENTRANDOM, TOBRATROUGH, TOBRAPEAK, TOBRARND, AMIKACINPEAK, AMIKACINTROU, AMIKACIN,  in the last 72 hours   Microbiology: No results found for this or any previous visit (from the past 720 hour(s)).  Medical History: Past Medical History  Diagnosis Date  . Parkinson's disease   . Hypertension   . Atrial fibrillation   . Breast cancer   . Degenerative arthritis   . Diplopia 09/27/2012  . Scoliosis   . Small bowel obstruction  06/2007  . Asthma   . Hypopotassemia   . Orthostatic hypotension   . GERD (gastroesophageal reflux disease)     Medications:  See PTA medication list  Assessment: 80 y/o female who presents to the ED from home with SOB. Code sepsis called. Pharmacy consulted to begin vancomycin. WBC are elevated and patient is currently afebrile. Renal function is normal. Zosyn 3.375 g IV also ordered.  Goal of Therapy:  Vancomycin trough level 15-20 mcg/ml  Plan:  - Vancomycin 1000 mg IV now then 500 mg IV q12h - Monitor renal function, clinical course, and culture data  [facility], Pharm.D., BCPS Clinical Pharmacist Pager: [phone] 06/22/2013 8:04 PM   Addendum:  Also consulted to begin Unasyn for aspiration pneumonia. Add Unasyn 1.5 g IV q6h  [facility], Pharm.D., BCPS Clinical Pharmacist Pager:  [phone] 06/22/2013 10:34 PM

## 2013-06-02 ENCOUNTER — Encounter (HOSPITAL_COMMUNITY): Payer: Self-pay | Admitting: *Deleted

## 2013-06-02 DIAGNOSIS — J9601 Acute respiratory failure with hypoxia: Secondary | ICD-10-CM | POA: Diagnosis present

## 2013-06-02 DIAGNOSIS — E871 Hypo-osmolality and hyponatremia: Secondary | ICD-10-CM

## 2013-06-02 DIAGNOSIS — G9341 Metabolic encephalopathy: Secondary | ICD-10-CM | POA: Diagnosis present

## 2013-06-02 LAB — CBC
HCT: 38 % (ref 36.0–46.0)
Hemoglobin: 12.6 g/dL (ref 12.0–15.0)
MCH: 31 pg (ref 26.0–34.0)
MCHC: 33.2 g/dL (ref 30.0–36.0)
MCV: 93.4 fL (ref 78.0–100.0)
Platelets: 264 10*3/uL (ref 150–400)
RBC: 4.07 MIL/uL (ref 3.87–5.11)
RDW: 13.8 % (ref 11.5–15.5)
WBC: 29 10*3/uL — ABNORMAL HIGH (ref 4.0–10.5)

## 2013-06-02 LAB — URINE CULTURE
Colony Count: NO GROWTH
Culture: NO GROWTH

## 2013-06-02 LAB — BASIC METABOLIC PANEL
BUN: 14 mg/dL (ref 6–23)
CO2: 23 mEq/L (ref 19–32)
CREATININE: 0.47 mg/dL — AB (ref 0.50–1.10)
Calcium: 8.5 mg/dL (ref 8.4–10.5)
Chloride: 95 mEq/L — ABNORMAL LOW (ref 96–112)
GFR, EST NON AFRICAN AMERICAN: 89 mL/min — AB (ref >90)
Glucose, Bld: 91 mg/dL (ref 70–99)
POTASSIUM: 4.1 meq/L (ref 3.7–5.3)
Sodium: 132 mEq/L — ABNORMAL LOW (ref 137–147)

## 2013-06-02 LAB — INFLUENZA PANEL BY PCR (TYPE A & B)
H1N1FLUPCR: NOT DETECTED
INFLAPCR: NEGATIVE
INFLBPCR: NEGATIVE

## 2013-06-02 LAB — HIV ANTIBODY (ROUTINE TESTING W REFLEX): HIV: NONREACTIVE

## 2013-06-02 LAB — MRSA PCR SCREENING: MRSA by PCR: NEGATIVE

## 2013-06-02 MED ORDER — HYDRALAZINE HCL 20 MG/ML IJ SOLN
5.0000 mg | Freq: Four times a day (QID) | INTRAMUSCULAR | Status: DC | PRN
Start: 2013-06-02 — End: 2013-06-05
  Administered 2013-06-03 – 2013-06-04 (×4): 5 mg via INTRAVENOUS
  Filled 2013-06-02 (×3): qty 1

## 2013-06-02 MED ORDER — ALBUTEROL SULFATE (2.5 MG/3ML) 0.083% IN NEBU: 2.5000 mg | INHALATION_SOLUTION | Freq: Four times a day (QID) | RESPIRATORY_TRACT | Status: DC | PRN

## 2013-06-02 MED ORDER — ALBUTEROL SULFATE (2.5 MG/3ML) 0.083% IN NEBU
3.0000 mL | INHALATION_SOLUTION | RESPIRATORY_TRACT | Status: DC | PRN
  Administered 2013-06-03 – 2013-06-05 (×3): 3 mL via RESPIRATORY_TRACT
  Filled 2013-06-02 (×3): qty 3

## 2013-06-02 MED ORDER — SODIUM CHLORIDE 0.9 % IV SOLN
1000.0000 mL | INTRAVENOUS | Status: DC
  Administered 2013-06-02 – 2013-06-04 (×2): 1000 mL via INTRAVENOUS

## 2013-06-02 NOTE — Progress Notes (Signed)
[facility]  Leslie Stevens [id number] DOB: [birth year] DOA: 06/26/2013 PCP: Vena Austria, MD  Time spent :  35 mins  Brief narrative: 80 y.o. female who presented to the ED in respiratory distress. SOB had been worsening over the past 3 days. Son had "flu" 1 week prior. Patient began to develop URI symptoms over 3 days. At time of presentation to the ED she was desatting to 74%. She was placed on a NRB and sats returned to baseline.  HPI/Subjective: Sleepy, no specific complaints but hx is quite difficult to obtain.    Assessment/Plan:  Acute respiratory failure with hypoxia / Aspiration pneumonia v/s CAP -cont oxygen and supportive care -CT concerning for aspiration etiology -cont empiric anbx's - leukocytosis slowly improving -SLP eval -Flu PCR neg - dc Tamiflu   Hypertension -poorly controlled -cont Inderal   Metabolic encephalopathy / Parkinson's disease -cont Sinemet  -likely AMS from acute infection and hypoxia -check swallow  Dehydration with hyponatremia -cont IVF but decrease rate from 125 to 50/hr  Atrial fibrillation -cont digoxin and BB -rate controlled  GERD  DVT prophylaxis: Subcutaneous heparin Code Status: DO NOT RESUSCITATE Family Communication: No family at bedside Disposition Plan/Expected LOS: Stepdown  Consultants: None  Procedures: None  Antibiotics: Unasyn 4/02 >>> Zosyn 4/02 Vancomycin 4/02 >>>4/3  Objective: Blood pressure 160/68, pulse 93, temperature 98.6 F (37 C), temperature source Oral, resp. rate 20, height 5\' 3"  (1.6 m), weight 128 lb 4.9 oz (58.2 kg), SpO2 99.00%.  Intake/Output Summary (Last 24 hours) at 06/02/13 1249 Last data filed at 06/02/13 0160  Gross per 24 hour  Intake 1277.08 ml  Output      0 ml  Net 1277.08 ml   Exam: General: No acute respiratory distress at rest  Lungs: Coarse to auscultation bilaterally without wheezes or crackles, has been weaned  from 100% nonrebreather to 4 L nasal cannula Cardiovascular: Irregular rate and rhythm without murmur gallop or rub normal S1 and S2, no peripheral edema or JVD Abdomen: Nontender, nondistended, soft, bowel sounds positive, no rebound, no ascites, no appreciable mass Musculoskeletal: No significant cyanosis, clubbing of bilateral lower extremities Neurological: Lethargic  Scheduled Meds:  Scheduled Meds: . ampicillin-sulbactam (UNASYN) IV  1.5 g Intravenous Q6H  . carbidopa-levodopa  0.5 tablet Oral TID WC  . darifenacin  7.5 mg Oral Daily  . digoxin  0.125 mg Oral QHS  . docusate sodium  100 mg Oral QHS  . guaiFENesin  600 mg Oral BID  . heparin  5,000 Units Subcutaneous 3 times per day  . loratadine  10 mg Oral Daily  . mirabegron ER  50 mg Oral Daily  . mometasone-formoterol  2 puff Inhalation BID  . oseltamivir  30 mg Oral BID  . propranolol  40 mg Oral BID  . vancomycin  500 mg Intravenous Q12H   Data Reviewed: Basic Metabolic Panel:  Recent Labs Lab 06/04/2013 1721 06/02/13 0313  NA 135* 132*  K 4.4 4.1  CL 94* 95*  CO2 25 23  GLUCOSE 105* 91  BUN 16 14  CREATININE 0.49* 0.47*  CALCIUM 9.1 8.5   Liver Function Tests:  Recent Labs Lab 06/24/2013 1721  AST 31  ALT 11  ALKPHOS 117  BILITOT 0.5  PROT 6.8  ALBUMIN 3.1*   CBC:  Recent Labs Lab 06/17/2013 1721 06/02/13 0313  WBC 30.4* 29.0*  NEUTROABS 28.9*  --   HGB 14.3 12.6  HCT 41.3 38.0  MCV  92.2 93.4  PLT 269 264    Recent Results (from the past 240 hour(s))  MRSA PCR SCREENING     Status: None   Collection Time    06/02/13 12:15 AM      Result Value Ref Range Status   MRSA by PCR NEGATIVE  NEGATIVE Final   Comment:            The GeneXpert MRSA Assay (FDA     approved for NASAL specimens     only), is one component of a     comprehensive MRSA colonization     surveillance program. It is not     intended to diagnose MRSA     infection nor to guide or     monitor treatment for     MRSA  infections.     Studies:  Recent x-ray studies have been reviewed in detail by the Attending Physician  Erin Hearing, ANP [facility] Office  [phone] Pager [phone]  **If unable to reach the above provider after paging please contact the [place] @ [phone]  On-Call/Text Page:      [email or web address]      password [email or web address]  If 7PM-7AM, please contact night-coverage [email or web address] Password [email or web address] 06/02/2013, 12:49 PM   LOS: 1 day   I have personally examined this patient and reviewed the entire database. I have reviewed the above note, made any necessary editorial changes, and agree with its content.  Cherene Altes, MD [facility]

## 2013-06-02 NOTE — Care Management Note (Addendum)
    Page 1 of 1   06/05/2013     11:28:36 AM   CARE MANAGEMENT NOTE 06/05/2013  Patient:  Leslie Stevens, Leslie Stevens   Account Number:  [id number]  Date Initiated:  06/02/2013  Documentation initiated by:  Elissa Hefty  Subjective/Objective Assessment:   adm w pneumonia     Action/Plan:   lives w fam, pcp dr Marcello Moores [place]   Anticipated DC Date:     Anticipated DC Plan:  [place]  CM consult      [facility] Choice  Resumption Of Svcs/PTA Provider   Choice offered to / List presented to:          [facility] arranged  [place]   Status of service:   Medicare Important Message given?   (If response is "NO", the following Medicare IM given date fields will be blank) Date Medicare IM given:   Date Additional Medicare IM given:    Discharge Disposition:  [place]  Per UR Regulation:  Reviewed for med. necessity/level of care/duration of stay  If discussed at [place] of Stay Meetings, dates discussed:   06/03/2013    Comments:

## 2013-06-02 NOTE — Evaluation (Signed)
Clinical/Bedside Swallow Evaluation Patient Details  Name: Leslie Stevens MRN: [id number] Date of Birth: 1927-09-19  Today's Date: 06/02/2013 Time: 7939-0300 SLP Time Calculation (min): 43 min  Past Medical History:  Past Medical History  Diagnosis Date  . Parkinson's disease   . Hypertension   . Atrial fibrillation   . Breast cancer   . Degenerative arthritis   . Diplopia 09/27/2012  . Scoliosis   . Small bowel obstruction  06/2007  . Asthma   . Hypopotassemia   . Orthostatic hypotension   . GERD (gastroesophageal reflux disease)    Past Surgical History:  Past Surgical History  Procedure Laterality Date  . Mastectomy    . Total hip arthroplasty Right   . Lumbar spine surgery    . Tonsillectomy    . Cataract extraction Bilateral    HPI:  80 y.o. female who presents to the ED in respiratory distress. SOB has been worsening over the past 3 days. Son had "flu" 1 week ago. Desatting to 74% in ED initially, put on NRB with SOB improving and sats now at baseline.  PMH: Parkinson's, HTN, diplopia, asthma, asthma, GERD.  CXR 4/2 Dense airspace opacification involving the inferior aspect of the right upper lobe and portions of the left lower lobe, compatible with significant pneumonia. Significant amount of fluid seen filling the bronchus and bronchioles to the left lower lobe, compatible with aspiration, additional scattered airspace opacities in the right lower lobe, and nonspecific peripheral nodules in the right upper and middle lung lobes.    Assessment / Plan / Recommendation Clinical Impression  Pt. confused, requiring mod-max verbal cues/encouragement during swallow assessment with son and son-in-law present.    Pt. with Parkinson's and silent aspiration highly suspected with decreased laryngeal elevation and likely delayed swallow initiation.  Recommend continue NPO with MBS tomorrow.       Aspiration Risk   (mod-severe)    Diet Recommendation Ice chips PRN after oral  care;NPO        Other  Recommendations Recommended Consults: MBS Oral Care Recommendations: Oral care BID   Follow Up Recommendations  Skilled Nursing facility    Frequency and Duration min 2x/week  2 weeks   Pertinent Vitals/Pain WDL      Swallow Study         Oral/Motor/Sensory Function Overall Oral Motor/Sensory Function:  (decreased cooperation)   Ice Chips Ice chips: Not tested   Thin Liquid Thin Liquid: Impaired Presentation: Cup;Spoon Pharyngeal  Phase Impairments: Decreased hyoid-laryngeal movement;Suspected delayed Swallow    Nectar Thick Nectar Thick Liquid: Not tested   Honey Thick Honey Thick Liquid: Not tested   Puree Puree: Impaired Presentation: Spoon Pharyngeal Phase Impairments: Suspected delayed Swallow;Decreased hyoid-laryngeal movement   Solid   GO    Solid: Not tested       Houston Siren M.Ed [employer] [phone]  06/02/2013

## 2013-06-02 NOTE — Evaluation (Signed)
Physical Therapy Evaluation Patient Details Name: Leslie Stevens MRN: [id number] DOB: 1928/02/26 Today's Date: 06/02/2013   History of Present Illness  HPI: Leslie Stevens is a 80 y.o. female who presents to the ED in respiratory distress. SOB has been worsening over the past 3 days.  Son had "flu" 1 week ago.  Patient began to develop URI symptoms for last 3 days.  Today in ED desatting to 74% initially, put on NRB with SOB improving and sats now at baseline.  Clinical Impression  Patient demonstrates deficits in functional mobility as indicated below. Will benefit from continued skilled PT to address deficits and maximize function. Will see as indicated and progress as tolerated.  Per family, son "BOBBY" will need to make final decisions regarding care, but at this time the sons that are here are open to exploring rehab.  Recommend ST SNF upon discharge.     Follow Up Recommendations SNF;Supervision/Assistance - 24 hour    Equipment Recommendations  None recommended by PT    Recommendations for Other Services       Precautions / Restrictions Precautions Precautions: Fall Restrictions Weight Bearing Restrictions: Yes RLE Weight Bearing: Weight bearing as tolerated (per patient knee does not support her) Other Position/Activity Restrictions: has a knee brace on RLE      Mobility  Bed Mobility Overal bed mobility: Needs Assistance;+2 for physical assistance Bed Mobility: Supine to Sit     Supine to sit: Max assist     General bed mobility comments: patient initiates but requires increased cues to attend to task, desaturates with mobility  Transfers Overall transfer level: Needs assistance Equipment used: 2 person hand held assist Transfers: Sit to/from Stand;Stand Pivot Transfers Sit to Stand: Max assist;+2 physical assistance Stand pivot transfers: Mod assist;+2 physical assistance       General transfer comment: max assist for stability, patient able to initiate some  steps to chair but requires significant assist   Ambulation/Gait                Stairs            Wheelchair Mobility    Modified Rankin (Stroke Patients Only)       Balance                                             Pertinent Vitals/Pain SpO2 fluctuating between 88% and 94% with activity on supplemental oxygen    Home Living Family/patient expects to be discharged to:: Private residence Living Arrangements: Children Available Help at Discharge: Family (son is currently sick with flu) Type of Home: House Home Access: Stairs to enter Entrance Stairs-Rails: None Entrance Stairs-Number of Steps: 3 Home Layout: One level Home Equipment: Environmental consultant - 2 wheels;Walker - 4 wheels      Prior Function Level of Independence: Needs assistance   Gait / Transfers Assistance Needed: close supervision for short distances reported per sons  ADL's / Homemaking Assistance Needed: assist with hygiene  Comments: history provided by sons, states patient was ambulatory with use of RW under supervision she could walk herself to the bathroom     Hand Dominance   Dominant Hand: Right    Extremity/Trunk Assessment   Upper Extremity Assessment: Generalized weakness           Lower Extremity Assessment: Generalized weakness (BLEs red and warm to touch, limited range bilaterally, wears  knee brace on RLE)      Cervical / Trunk Assessment: Kyphotic (scholiotic)  Communication   Communication: HOH  Cognition Arousal/Alertness: Awake/alert Behavior During Therapy: WFL for tasks assessed/performed Overall Cognitive Status: Impaired/Different from baseline Area of Impairment: Attention;Memory;Following commands;Safety/judgement;Awareness;Problem solving   Current Attention Level: Selective Memory: Decreased short-term memory Following Commands: Follows one step commands consistently;Follows one step commands with increased time Safety/Judgement: Decreased  awareness of safety;Decreased awareness of deficits Awareness: Emergent Problem Solving: Slow processing;Decreased initiation;Difficulty sequencing;Requires verbal cues;Requires tactile cues General Comments: patient not aware of incontinence when yesterday she was able to ring for assist, patient with difficulty word finding    General Comments General comments (skin integrity, edema, etc.): patient appears very weak, multiple areas of skin breakdown/tears, bilateral LEs red and warm to touch. Questionable hygiene history.  Patient was incontinent during session, hygiene performed prior to mobility.    Exercises        Assessment/Plan    PT Assessment Patient needs continued PT services  PT Diagnosis Difficulty walking;Abnormality of gait;Generalized weakness;Altered mental status   PT Problem List Decreased strength;Decreased range of motion;Decreased activity tolerance;Decreased balance;Decreased mobility;Decreased cognition;Decreased knowledge of use of DME;Cardiopulmonary status limiting activity  PT Treatment Interventions DME instruction;Gait training;Stair training;Functional mobility training;Therapeutic activities;Therapeutic exercise;Balance training;Patient/family education   PT Goals (Current goals can be found in the Care Plan section) Acute Rehab PT Goals Patient Stated Goal: none stated PT Goal Formulation: With patient/family Time For Goal Achievement: 06/16/13 Potential to Achieve Goals: Fair    Frequency Min 3X/week   Barriers to discharge        Co-evaluation               End of Session Equipment Utilized During Treatment: Gait belt;Oxygen Activity Tolerance: Patient limited by fatigue Patient left: in chair;with call bell/phone within reach;with nursing/sitter in room;with family/visitor present Nurse Communication: Mobility status         Time: 1431-1458 PT Time Calculation (min): 27 min   Charges:   PT Evaluation $Initial PT Evaluation  Tier I: 1 Procedure PT Treatments $Therapeutic Activity: 8-22 mins $Self Care/Home Management: 8-22   PT G CodesDuncan Dull 06/02/2013, 3:10 PM Alben Deeds, [place] DPT  [phone]

## 2013-06-03 ENCOUNTER — Inpatient Hospital Stay (HOSPITAL_COMMUNITY): Payer: Medicare Other

## 2013-06-03 LAB — DIGOXIN LEVEL: Digoxin Level: 0.8 ng/mL (ref 0.8–2.0)

## 2013-06-03 LAB — EXPECTORATED SPUTUM ASSESSMENT W REFEX TO RESP CULTURE

## 2013-06-03 LAB — BASIC METABOLIC PANEL
BUN: 16 mg/dL (ref 6–23)
CO2: 26 mEq/L (ref 19–32)
CREATININE: 0.53 mg/dL (ref 0.50–1.10)
Calcium: 8.8 mg/dL (ref 8.4–10.5)
Chloride: 100 mEq/L (ref 96–112)
GFR calc non Af Amer: 85 mL/min — ABNORMAL LOW (ref >90)
Glucose, Bld: 100 mg/dL — ABNORMAL HIGH (ref 70–99)
Potassium: 3.8 mEq/L (ref 3.7–5.3)
Sodium: 140 mEq/L (ref 137–147)

## 2013-06-03 LAB — CBC
HCT: 37.1 % (ref 36.0–46.0)
Hemoglobin: 12.3 g/dL (ref 12.0–15.0)
MCH: 31 pg (ref 26.0–34.0)
MCHC: 33.2 g/dL (ref 30.0–36.0)
MCV: 93.5 fL (ref 78.0–100.0)
PLATELETS: 266 10*3/uL (ref 150–400)
RBC: 3.97 MIL/uL (ref 3.87–5.11)
RDW: 13.9 % (ref 11.5–15.5)
WBC: 18 10*3/uL — AB (ref 4.0–10.5)

## 2013-06-03 LAB — EXPECTORATED SPUTUM ASSESSMENT W GRAM STAIN, RFLX TO RESP C

## 2013-06-03 NOTE — Progress Notes (Signed)
OT Cancellation Note  Patient Details Name: Leslie Stevens MRN: [id number] DOB: 1928-02-22   Cancelled Treatment:    Reason Eval/Treat Not Completed: MD with pt/family.  Will try back.  Darlina Rumpf [place], OTR/L [phone] 06/03/2013, 4:23 PM

## 2013-06-03 NOTE — Procedures (Signed)
Objective Swallowing Evaluation: Modified Barium Swallowing Study  Patient Details  Name: Leslie Stevens MRN: [id number] Date of Birth: 06-02-1927  Today's Date: 06/03/2013 Time: 1110-1145 SLP Time Calculation (min): 35 min  Past Medical History:  Past Medical History  Diagnosis Date  . Parkinson's disease   . Hypertension   . Atrial fibrillation   . Breast cancer   . Degenerative arthritis   . Diplopia 09/27/2012  . Scoliosis   . Small bowel obstruction  06/2007  . Asthma   . Hypopotassemia   . Orthostatic hypotension   . GERD (gastroesophageal reflux disease)    Past Surgical History:  Past Surgical History  Procedure Laterality Date  . Mastectomy    . Total hip arthroplasty Right   . Lumbar spine surgery    . Tonsillectomy    . Cataract extraction Bilateral    HPI:  80 y.o. female who presents to the ED in respiratory distress. SOB has been worsening over the past 3 days. Son had "flu" 1 week ago. Desatting to 74% in ED initially, put on NRB with SOB improving and sats now at baseline.  PMH: Parkinson's, HTN, diplopia, asthma, asthma, GERD.  CXR 4/2 Dense airspace opacification involving the inferior aspect of the right upper lobe and portions of the left lower lobe, compatible with significant pneumonia. Significant amount of fluid seen filling the bronchus and bronchioles to the left lower lobe, compatible with aspiration, additional scattered airspace opacities in the right lower lobe, and nonspecific peripheral nodules in the right upper and middle lung lobes.      Assessment / Plan / Recommendation Clinical Impression  Dysphagia Diagnosis: Moderate pharyngeal phase dysphagia;Moderate cervical esophageal phase dysphagia;Moderate-Severe oral phase dysphagia Clinical impression: Pt presented with mod-severe oral, pharyngeal, and cervical esophageal dysphagia. This was characterized by tongue pumping/ reduced ant-post movement, decreased base of tongue movement, delayed  swallow trigger, oral/ vallecular residue, and backflow into cervical esophagus. Pt had very poor posture, leaning head forward the entire time also likely contributing to swallowing difficulties; SLP held pt's head up for trials but still could not get completely upright. Pt aspirated on trial of thin liquid via tsp and with weak cough was unable to completely clear. Penetrated on straw sips of nectar-thick, only flash penetration on tsp of nectar-thick liquids but still significant residue across all consistencies in valleculae and pyriforms, putting pt at risk of later aspirating penetrates. With puree, material sat in cervical esophagus, some of which cleared when followed with liquid- posture likely contributed to this. Pt is at severe risk of aspiration. Given pt's current medical status and swallow function, cannot recommend safe diet at this time. Rx that pt remain NPO, meds via alternative means. May continue providing ice chips following thorough oral care- pt needs frequent oral care AT LEAST 4x/ day given thick oral secretions. Will cont to follow.    Treatment Recommendation  Therapy as outlined in treatment plan below    Diet Recommendation Ice chips PRN after oral care;NPO   Medication Administration: Via alternative means    Other  Recommendations Oral Care Recommendations: Oral care Q4 per protocol;Oral care prior to ice chips Other Recommendations: Have oral suction available   Follow Up Recommendations  Skilled Nursing facility    Frequency and Duration min 2x/week  2 weeks   Pertinent Vitals/Pain N/A    SLP Swallow Goals     General HPI: 80 y.o. female who presents to the ED in respiratory distress. SOB has been worsening over the past  3 days. Son had "flu" 1 week ago. Desatting to 74% in ED initially, put on NRB with SOB improving and sats now at baseline.  PMH: Parkinson's, HTN, diplopia, asthma, asthma, GERD.  CXR 4/2 Dense airspace opacification involving the inferior  aspect of the right upper lobe and portions of the left lower lobe, compatible with significant pneumonia. Significant amount of fluid seen filling the bronchus and bronchioles to the left lower lobe, compatible with aspiration, additional scattered airspace opacities in the right lower lobe, and nonspecific peripheral nodules in the right upper and middle lung lobes.  Type of Study: Modified Barium Swallowing Study Reason for Referral: Objectively evaluate swallowing function Previous Swallow Assessment: BSE 06/02/13 Diet Prior to this Study: NPO Temperature Spikes Noted: No Respiratory Status: Nasal cannula History of Recent Intubation: No Behavior/Cognition: Alert;Cooperative;Requires cueing Oral Cavity - Dentition: Dentures, top Self-Feeding Abilities: Needs assist Patient Positioning: Upright in chair Baseline Vocal Quality: Clear;Low vocal intensity Volitional Cough: Congested;Wet Anatomy: Other (Comment) (difficult to assess with posture)    Reason for Referral Objectively evaluate swallowing function   Oral Phase Oral Preparation/Oral Phase Oral Phase: Impaired Oral - Nectar Oral - Nectar Teaspoon: Lingual pumping;Lingual/palatal residue;Reduced posterior propulsion;Weak lingual manipulation Oral - Nectar Straw: Weak lingual manipulation;Lingual pumping;Reduced posterior propulsion;Lingual/palatal residue Oral - Thin Oral - Thin Teaspoon: Weak lingual manipulation;Lingual pumping;Reduced posterior propulsion;Lingual/palatal residue Oral - Solids Oral - Puree: Weak lingual manipulation;Lingual pumping;Reduced posterior propulsion;Lingual/palatal residue   Pharyngeal Phase Pharyngeal Phase Pharyngeal Phase: Impaired Pharyngeal - Nectar Pharyngeal - Nectar Teaspoon: Delayed swallow initiation;Premature spillage to valleculae;Premature spillage to pyriform sinuses;Reduced tongue base retraction;Pharyngeal residue - pyriform sinuses;Pharyngeal residue -  valleculae;Penetration/Aspiration during swallow Penetration/Aspiration details (nectar teaspoon): Material enters airway, remains ABOVE vocal cords and not ejected out Pharyngeal - Nectar Straw: Delayed swallow initiation;Premature spillage to valleculae;Premature spillage to pyriform sinuses;Reduced airway/laryngeal closure;Reduced tongue base retraction;Penetration/Aspiration during swallow Penetration/Aspiration details (nectar straw): Material enters airway, remains ABOVE vocal cords and not ejected out Pharyngeal - Thin Pharyngeal - Thin Teaspoon: Delayed swallow initiation;Premature spillage to valleculae;Premature spillage to pyriform sinuses;Reduced airway/laryngeal closure;Reduced tongue base retraction;Penetration/Aspiration during swallow;Trace aspiration;Moderate aspiration Penetration/Aspiration details (thin teaspoon): Material enters airway, passes BELOW cords and not ejected out despite cough attempt by patient Pharyngeal - Solids Pharyngeal - Puree: Delayed swallow initiation;Premature spillage to valleculae;Premature spillage to pyriform sinuses;Reduced tongue base retraction;Pharyngeal residue - valleculae;Pharyngeal residue - pyriform sinuses (sat in esophagus, some cleared with liquid )  Cervical Esophageal Phase    GO    Cervical Esophageal Phase Cervical Esophageal Phase: Impaired Cervical Esophageal Phase - Solids Puree: Esophageal backflow into cervical esophagus         Kern Reap, MA, CCC-SLP 06/03/2013, 12:07 PM

## 2013-06-03 NOTE — Progress Notes (Signed)
[facility]  Leslie Stevens XBD:[id number] DOB: [birth year] DOA: 06-23-13 PCP: Vena Austria, MD  Time spent :  35 mins  Brief narrative: 80 y.o. female who presented to the ED in respiratory distress. SOB had been worsening over 3 days. At time of presentation to the ED she was desatting to 74%. She was placed on a NRB and sats returned to baseline.  HPI/Subjective: More alert today, and interacting w/ family.  When asked about tube feeds, she states "let me die."  Family confirms that she has made it very clear in the past that she is not interested in even temp tube feeding.      Assessment/Plan:  Acute respiratory failure with hypoxia / multilobar aspiration pneumonia  -cont supportive care -CT c/w aspiration etiology -cont empiric anbx's - leukocytosis slowly improving -SLP eval confirmed dysphagia - long discussion held w/ pt and children at bedside - pt is not interested in tube feeding of any kind, and family supports this decision - we will begin dysphagia diet with acceptance of aspiration risk   Dysphagia  -see discussion above - likely postural (kyphosis) + muscular (Parkinson's) discoordination - pt is hungry - begin modified diet w/ acceptance of aspiration risk   Hypertension -poorly controlled - adjust tx and follow trend   Metabolic encephalopathy / Parkinson's disease -cont Sinemet  -likely AMS from acute infection and hypoxia -mental status appears to be improving   Dehydration with hyponatremia -resolved w/ volume resuscitation   Chronic Atrial fibrillation -cont digoxin and BB -rate controlled  GERD  DVT prophylaxis: Subcutaneous heparin Code Status: DO NOT RESUSCITATE Family Communication: spoke w/ multiple children at bedside  Disposition Plan/Expected LOS: Stepdown  Consultants: None  Procedures: None  Antibiotics: Unasyn 4/02 >>> Zosyn 4/02 Vancomycin 4/02 >>>4/3  Objective: Blood  pressure 180/78, pulse 94, temperature 97.9 F (36.6 C), temperature source Oral, resp. rate 19, height 5\' 3"  (1.6 m), weight 58.2 kg (128 lb 4.9 oz), SpO2 95.00%.  Intake/Output Summary (Last 24 hours) at 06/03/13 1552 Last data filed at 06/03/13 0800  Gross per 24 hour  Intake   1275 ml  Output      0 ml  Net   1275 ml   Exam: General: No acute respiratory distress at rest - kyphotic  Lungs: Coarse crackles B bases - mild diffuse exp wheeze  Cardiovascular: Irregular rate and rhythm without murmur gallop or rub normal S1 and S2, no peripheral edema or JVD Abdomen: Nontender, nondistended, soft, bowel sounds positive, no rebound, no ascites, no appreciable mass Musculoskeletal: No significant cyanosis, clubbing of bilateral lower extremities Neurological: more alert - answers some questions   Scheduled Meds:  Scheduled Meds: . ampicillin-sulbactam (UNASYN) IV  1.5 g Intravenous Q6H  . carbidopa-levodopa  0.5 tablet Oral TID WC  . darifenacin  7.5 mg Oral Daily  . digoxin  0.125 mg Oral QHS  . docusate sodium  100 mg Oral QHS  . guaiFENesin  600 mg Oral BID  . heparin  5,000 Units Subcutaneous 3 times per day  . loratadine  10 mg Oral Daily  . mirabegron ER  50 mg Oral Daily  . mometasone-formoterol  2 puff Inhalation BID  . propranolol  40 mg Oral BID   Data Reviewed: Basic Metabolic Panel:  Recent Labs Lab 06/23/13 1721 06/02/13 0313 06/03/13 0305  NA 135* 132* 140  K 4.4 4.1 3.8  CL 94* 95* 100  CO2 25 23 26  GLUCOSE 105* 91 100*  BUN 16 14 16   CREATININE 0.49* 0.47* 0.53  CALCIUM 9.1 8.5 8.8   Liver Function Tests:  Recent Labs Lab 06/05/2013 1721  AST 31  ALT 11  ALKPHOS 117  BILITOT 0.5  PROT 6.8  ALBUMIN 3.1*   CBC:  Recent Labs Lab 06/22/2013 1721 06/02/13 0313 06/03/13 0305  WBC 30.4* 29.0* 18.0*  NEUTROABS 28.9*  --   --   HGB 14.3 12.6 12.3  HCT 41.3 38.0 37.1  MCV 92.2 93.4 93.5  PLT 269 264 266    Recent Results (from the past 240  hour(s))  URINE CULTURE     Status: None   Collection Time    06/11/2013  5:30 PM      Result Value Ref Range Status   Specimen Description URINE, CATHETERIZED   Final   Special Requests NONE   Final   Culture  Setup Time     Final   Value: 06/04/2013 22:02     Performed at [facility] Count     Final   Value: NO GROWTH     Performed at [facility]   Culture     Final   Value: NO GROWTH     Performed at [facility]   Report Status 06/02/2013 FINAL   Final  CULTURE, BLOOD (ROUTINE X 2)     Status: None   Collection Time    06/26/2013  6:20 PM      Result Value Ref Range Status   Specimen Description BLOOD HAND RIGHT   Final   Special Requests BOTTLES DRAWN AEROBIC AND ANAEROBIC 5CCBLUE 2CCRED   Final   Culture  Setup Time     Final   Value: 06/02/2013 00:24     Performed at [facility]   Culture     Final   Value:        BLOOD CULTURE RECEIVED NO GROWTH TO DATE CULTURE WILL BE HELD FOR 5 DAYS BEFORE ISSUING A FINAL NEGATIVE REPORT     Performed at [facility]   Report Status PENDING   Incomplete  CULTURE, BLOOD (ROUTINE X 2)     Status: None   Collection Time    06/08/2013  6:55 PM      Result Value Ref Range Status   Specimen Description BLOOD LEFT FOREARM   Final   Special Requests BOTTLES DRAWN AEROBIC AND ANAEROBIC 5CC   Final   Culture  Setup Time     Final   Value: 06/02/2013 00:24     Performed at [facility]   Culture     Final   Value:        BLOOD CULTURE RECEIVED NO GROWTH TO DATE CULTURE WILL BE HELD FOR 5 DAYS BEFORE ISSUING A FINAL NEGATIVE REPORT     Performed at [facility]   Report Status PENDING   Incomplete  MRSA PCR SCREENING     Status: None   Collection Time    06/02/13 12:15 AM      Result Value Ref Range Status   MRSA by PCR NEGATIVE  NEGATIVE Final   Comment:            The GeneXpert MRSA Assay (FDA     approved for NASAL specimens     only), is one component of a      comprehensive MRSA colonization     surveillance program. It is not  intended to diagnose MRSA     infection nor to guide or     monitor treatment for     MRSA infections.  CULTURE, EXPECTORATED SPUTUM-ASSESSMENT     Status: None   Collection Time    06/03/13  5:41 AM      Result Value Ref Range Status   Specimen Description SPUTUM   Final   Special Requests NONE   Final   Sputum evaluation     Final   Value: THIS SPECIMEN IS ACCEPTABLE. RESPIRATORY CULTURE REPORT TO FOLLOW.   Report Status 06/03/2013 FINAL   Final     Studies:  Recent x-ray studies have been reviewed in detail by the Attending Physician  Cherene Altes, MD [facility] For Consults/Admissions - Flow Manager - [phone] Office  [phone] Pager [phone]  On-Call/Text Page:      [email or web address]      password [facility]  06/03/2013, 3:52 PM   LOS: 2 days

## 2013-06-03 NOTE — Progress Notes (Signed)
Increased FiO2 per protocol due to de-sat issue.

## 2013-06-04 ENCOUNTER — Inpatient Hospital Stay (HOSPITAL_COMMUNITY): Payer: Medicare Other

## 2013-06-04 LAB — BLOOD GAS, ARTERIAL
Acid-Base Excess: 0.1 mmol/L (ref 0.0–2.0)
Bicarbonate: 24.8 mEq/L — ABNORMAL HIGH (ref 20.0–24.0)
DELIVERY SYSTEMS: POSITIVE
EXPIRATORY PAP: 6
FIO2: 0.5 %
INSPIRATORY PAP: 14
Mode: POSITIVE
O2 SAT: 99.6 %
PATIENT TEMPERATURE: 98.6
TCO2: 26.2 mmol/L (ref 0–100)
pCO2 arterial: 44.6 mmHg (ref 35.0–45.0)
pH, Arterial: 7.364 (ref 7.350–7.450)
pO2, Arterial: 159 mmHg — ABNORMAL HIGH (ref 80.0–100.0)

## 2013-06-04 LAB — POCT I-STAT 3, ART BLOOD GAS (G3+)
BICARBONATE: 31.2 meq/L — AB (ref 20.0–24.0)
O2 Saturation: 90 %
PCO2 ART: 83.5 mmHg — AB (ref 35.0–45.0)
PO2 ART: 77 mmHg — AB (ref 80.0–100.0)
Patient temperature: 98.4
TCO2: 34 mmol/L (ref 0–100)
pH, Arterial: 7.18 — CL (ref 7.350–7.450)

## 2013-06-04 LAB — GLUCOSE, CAPILLARY: GLUCOSE-CAPILLARY: 110 mg/dL — AB (ref 70–99)

## 2013-06-04 MED ORDER — DIGOXIN 0.25 MG/ML IJ SOLN
0.1250 mg | Freq: Once | INTRAMUSCULAR | Status: AC
  Administered 2013-06-04: 0.125 mg via INTRAVENOUS
  Filled 2013-06-04: qty 0.5

## 2013-06-04 NOTE — Progress Notes (Signed)
[facility]  Leslie Stevens [id number]:[id number] DOB: 27-Mar-1927 DOA: 06/26/2013 PCP: Vena Austria, MD  Time spent :  35 mins  Brief narrative: 80 y.o. female who presented to the ED in respiratory distress. SOB had been worsening over 3 days. At time of presentation to the ED she was desatting to 74%. She was placed on a NRB and sats returned to baseline.  HPI/Subjective: Was noted to be more sedate last night, prompting ABG which revealed hyerpcarbic resp failure.  BIPAP was initiated.  Though ABG improved w/ BIPA, mental status has not improved and pt remains very sedate.  I suspect her overall illness is leading the the lethargy, which is leading to hypoventilation w/ hypercarbia.      Assessment/Plan:  Acute respiratory failure with hypoxia and hyerpcarbia / multilobar aspiration pneumonia  -cont supportive care - use BIPAP prn, but recognize this is only a temporizing measure and will not be used continuous for prolonged period of time in absence of clear evidence of clinical improvement (is relatively contraindicated in setting of aspiration, but in that pt is NCB use if felt to be reasonable) -CT c/w aspiration etiology -cont empiric anbx's  -SLP eval confirmed dysphagia - long discussion held w/ pt and children at bedside - pt is not interested in tube feeding of any kind, and family supports this decision - we will begin dysphagia diet with acceptance of aspiration risk   Dysphagia  -see discussion above - likely postural (kyphosis) + muscular (Parkinson's) discoordination - began modified diet w/ acceptance of aspiration risk   Hypertension -no change in tx plan today    Metabolic encephalopathy / Parkinson's disease -cont Sinemet  -likely AMS from acute infection and hypoxia and now hypercarbia as well  -mental status has declined significantly again - see discussion above   Dehydration with hyponatremia  -resolved w/ volume  resuscitation   Chronic Atrial fibrillation -cont digoxin and BB -rate controlled -was not on anticoag as outpt - given clinical situation pt is not felt to be an appropriate candidate for anticoag  GERD  DVT prophylaxis: Subcutaneous heparin Code Status: DO NOT RESUSCITATE Family Communication: spoke w/ son at bedside  Disposition Plan/Expected LOS: Stepdown  Consultants: None  Procedures: None  Antibiotics: Unasyn 4/02 >> Zosyn 4/02 Vancomycin 4/02 >>>4/3  Objective: Blood pressure 157/83, pulse 89, temperature 97.7 F (36.5 C), temperature source Axillary, resp. rate 23, height 5\' 3"  (1.6 m), weight 58.2 kg (128 lb 4.9 oz), SpO2 98.00%.  Intake/Output Summary (Last 24 hours) at 06/04/13 1147 Last data filed at 06/04/13 0700  Gross per 24 hour  Intake 1140.83 ml  Output      1 ml  Net 1139.83 ml   Exam: General: No acute respiratory distress evident - essentially obtunded - kyphotic  Lungs: Coarse crackles B bases - poor air movement th/o despite BIPAP  Cardiovascular: Irregular rate and rhythm without murmur gallop or rub normal S1 and S2, no peripheral edema  Abdomen: Nontender, nondistended, soft, bowel sounds positive, no rebound, no ascites, no appreciable mass Musculoskeletal: No significant cyanosis, clubbing of bilateral lower extremities  Scheduled Meds:  Scheduled Meds: . ampicillin-sulbactam (UNASYN) IV  1.5 g Intravenous Q6H  . carbidopa-levodopa  0.5 tablet Oral TID WC  . darifenacin  7.5 mg Oral Daily  . digoxin  0.125 mg Oral QHS  . docusate sodium  100 mg Oral QHS  . guaiFENesin  600 mg Oral BID  . heparin  5,000 Units Subcutaneous 3 times per day  . loratadine  10 mg Oral Daily  . mirabegron ER  50 mg Oral Daily  . mometasone-formoterol  2 puff Inhalation BID  . propranolol  40 mg Oral BID   Data Reviewed: Basic Metabolic Panel:  Recent Labs Lab 06/09/2013 1721 06/02/13 0313 06/03/13 0305  NA 135* 132* 140  K 4.4 4.1 3.8  CL 94* 95*  100  CO2 25 23 26   GLUCOSE 105* 91 100*  BUN 16 14 16   CREATININE 0.49* 0.47* 0.53  CALCIUM 9.1 8.5 8.8   Liver Function Tests:  Recent Labs Lab 06/07/2013 1721  AST 31  ALT 11  ALKPHOS 117  BILITOT 0.5  PROT 6.8  ALBUMIN 3.1*   CBC:  Recent Labs Lab 06/11/2013 1721 06/02/13 0313 06/03/13 0305  WBC 30.4* 29.0* 18.0*  NEUTROABS 28.9*  --   --   HGB 14.3 12.6 12.3  HCT 41.3 38.0 37.1  MCV 92.2 93.4 93.5  PLT 269 264 266    Recent Results (from the past 240 hour(s))  URINE CULTURE     Status: None   Collection Time    06/22/2013  5:30 PM      Result Value Ref Range Status   Specimen Description URINE, CATHETERIZED   Final   Special Requests NONE   Final   Culture  Setup Time     Final   Value: 06/05/2013 22:02     Performed at [facility]     Final   Value: NO GROWTH     Performed at [facility]   Culture     Final   Value: NO GROWTH     Performed at [facility]   Report Status 06/02/2013 FINAL   Final  CULTURE, BLOOD (ROUTINE X 2)     Status: None   Collection Time    06/04/2013  6:20 PM      Result Value Ref Range Status   Specimen Description BLOOD HAND RIGHT   Final   Special Requests BOTTLES DRAWN AEROBIC AND ANAEROBIC 5CCBLUE 2CCRED   Final   Culture  Setup Time     Final   Value: 06/02/2013 00:24     Performed at [facility]   Culture     Final   Value:        BLOOD CULTURE RECEIVED NO GROWTH TO DATE CULTURE WILL BE HELD FOR 5 DAYS BEFORE ISSUING A FINAL NEGATIVE REPORT     Performed at [facility]   Report Status PENDING   Incomplete  CULTURE, BLOOD (ROUTINE X 2)     Status: None   Collection Time    06/25/2013  6:55 PM      Result Value Ref Range Status   Specimen Description BLOOD LEFT FOREARM   Final   Special Requests BOTTLES DRAWN AEROBIC AND ANAEROBIC 5CC   Final   Culture  Setup Time     Final   Value: 06/02/2013 00:24     Performed at [facility]   Culture     Final    Value:        BLOOD CULTURE RECEIVED NO GROWTH TO DATE CULTURE WILL BE HELD FOR 5 DAYS BEFORE ISSUING A FINAL NEGATIVE REPORT     Performed at [facility]   Report Status PENDING   Incomplete  MRSA PCR SCREENING     Status: None   Collection Time  06/02/13 12:15 AM      Result Value Ref Range Status   MRSA by PCR NEGATIVE  NEGATIVE Final   Comment:            The GeneXpert MRSA Assay (FDA     approved for NASAL specimens     only), is one component of a     comprehensive MRSA colonization     surveillance program. It is not     intended to diagnose MRSA     infection nor to guide or     monitor treatment for     MRSA infections.  CULTURE, EXPECTORATED SPUTUM-ASSESSMENT     Status: None   Collection Time    06/03/13  5:41 AM      Result Value Ref Range Status   Specimen Description SPUTUM   Final   Special Requests NONE   Final   Sputum evaluation     Final   Value: THIS SPECIMEN IS ACCEPTABLE. RESPIRATORY CULTURE REPORT TO FOLLOW.   Report Status 06/03/2013 FINAL   Final  CULTURE, RESPIRATORY (NON-EXPECTORATED)     Status: None   Collection Time    06/03/13  5:41 AM      Result Value Ref Range Status   Specimen Description SPUTUM   Final   Special Requests NONE   Final   Gram Stain     Final   Value: MODERATE WBC PRESENT, PREDOMINANTLY PMN     RARE SQUAMOUS EPITHELIAL CELLS PRESENT     NO ORGANISMS SEEN     Performed at [facility]   Culture PENDING   Incomplete   Report Status PENDING   Incomplete     Studies:  Recent x-ray studies have been reviewed in detail by the Attending Physician   Cherene Altes, MD [facility] For Consults/Admissions - Flow Manager - [phone] Office  [phone] Pager [phone]  On-Call/Text Page:      [email or web address]      password [facility]  06/04/2013, 11:47 AM   LOS: 3 days

## 2013-06-04 NOTE — Progress Notes (Signed)
Notified Md. Pt not responding except for pain.  Unable to take po meds.  New orders received. Will continue to monitor. Saunders Revel T

## 2013-06-04 NOTE — Progress Notes (Signed)
Occupational Therapy Discharge Patient Details Name: Leslie Stevens MRN: [id number] DOB: [birth year] Today's Date: 06/04/2013 Time:  -     Patient discharged from OT services secondary to medical decline - will need to re-order OT to resume therapy services.   : OT orders discontinued by MD.    Faustino Congress [name], OTR/L [phone] 06/04/2013, 10:02 AM

## 2013-06-04 NOTE — Progress Notes (Signed)
Md notified.  Pt bathed and would open eyes.  Now on 4L [place] and still lethargic.  Not following commands as did before VS stable bp 135/62 Hr 83 afib  o2 93 on 4L [place].  rr 23.  Md to bedside. Will continue to monitor.  Saunders Revel T

## 2013-06-04 NOTE — Progress Notes (Signed)
Critical ABG values reported to Saunders Revel, Therapist, sports.

## 2013-06-04 NOTE — Progress Notes (Signed)
CRITICAL VALUE ALERT  Critical value received: ABG PH 7.178, PCo 83.9, Po2 77 Date of notification:  06/04/13 Time of notification:  0600  Critical value read back:yes  Nurse who received alert:  Saunders Revel  MD notified (1st page):  Rogue Bussing  Time of first page:  0600  MD notified (2nd page):  Time of second page:  Responding MD:  Rogue Bussing  Time MD responded:  [phone]

## 2013-06-04 NOTE — Progress Notes (Signed)
Notified Md about pt being lethargic.  Pt will try to follow commands.  VS stable 126/54 hr 76 afib.  o2 sats 94% on VM 40% .  Will try to wean off VM and monitor pt and if stays lethargic will notify MD again.  Placed pt on 5L [place] sats 96%.  Will continue to monitor. Saunders Revel T

## 2013-06-04 NOTE — Progress Notes (Signed)
Notified pts family about change in mental status. VS stable. Will continue to [employer], [place] T

## 2013-06-04 NOTE — Progress Notes (Signed)
[facility] hospital[facility] progress note. Chief complaint. Decreased level of consciousness. History of present illness. This 80 year old female in hospital the to respiratory failure from multilobar aspiration pneumonia. He has been noted with metabolic encephalopathy thought secondary to acute infection and hypoxia. Nursing noted the patient's mental status appeared to be worse than baseline this morning and I came up to see the patient at bedside. I found her hypersomnolent though appears to be moving extremities independently. She has been weaned from venturi mask to nasal cannula with O2 sats reading in the 90s. Patient blood glucose approximately 110 thus, hypoglycemia not a factor. Requested an arterial blood gas and this has resulted pH 7.170, PCO2 83.5, PO2 77, bicarbonate 31.2. Vital signs. Temperature 98.4, pulse 86, respiration 24, blood pressure 135/62. O2 sats 97%. General appearance. Frail elderly female who is hypersomnolent and only minimally arousable. She does not follow commands. Cardiac. Rate and rhythm regular. Lungs. Fine crackles in the bases. Decreased sounds in the bases but generally poor air movement. No apparent indication of respiratory distress or tachypnea. Abdomen. Soft with positive bowel sounds. Impression/plan. Problem #1. Hypoxic hypercarbic respiratory failure. Patient is DO NOT RESUSCITATE thus intubation not considered. I will place the patient on BiPAP support per respiratory therapy. We will obtain a portable chest x-ray to evaluate for any congestive heart failure or worsening pneumonia. Requested a repeat arterial blood gas for approximately 3 hours post initiation of BiPAP to assess effectiveness.

## 2013-06-04 NOTE — Progress Notes (Signed)
ANTIBIOTIC CONSULT NOTE - Follow-up  Pharmacy Consult for Unasyn Indication: sepsis  No Known Allergies  Patient Measurements: 57.6 kg  Vital Signs: Temp: 97.7 F (36.5 C) (04/05 0809) Temp src: Axillary (04/05 0809) BP: 157/83 mmHg (04/05 0809) Pulse Rate: 89 (04/05 0943) Intake/Output from previous day: 04/04 0701 - 04/05 0700 In: 1340.8 [P.O.:60; I.V.:1130.8; IV Piggyback:150] Out: 1 [Urine:1] Intake/Output from this shift:    Labs:  Recent Labs  06/03/2013 1721 06/02/13 0313 06/03/13 0305  WBC 30.4* 29.0* 18.0*  HGB 14.3 12.6 12.3  PLT 269 264 266  CREATININE 0.49* 0.47* 0.53   Est CrCl ~48 ml/min Estimated Creatinine Clearance: 44.1 ml/min (by C-G formula based on Cr of 0.53). No results found for this basename: VANCOTROUGH, Corlis Leak, VANCORANDOM, [place], [place], GENTRANDOM, TOBRATROUGH, TOBRAPEAK, TOBRARND, AMIKACINPEAK, AMIKACINTROU, AMIKACIN,  in the last 72 hours   Microbiology: Recent Results (from the past 720 hour(s))  URINE CULTURE     Status: None   Collection Time    06/22/2013  5:30 PM      Result Value Ref Range Status   Specimen Description URINE, CATHETERIZED   Final   Special Requests NONE   Final   Culture  Setup Time     Final   Value: 06/03/2013 22:02     Performed at [place]     Final   Value: NO GROWTH     Performed at [facility]   Culture     Final   Value: NO GROWTH     Performed at [facility]   Report Status 06/02/2013 FINAL   Final  CULTURE, BLOOD (ROUTINE X 2)     Status: None   Collection Time    06/13/2013  6:20 PM      Result Value Ref Range Status   Specimen Description BLOOD HAND RIGHT   Final   Special Requests BOTTLES DRAWN AEROBIC AND ANAEROBIC 5CCBLUE 2CCRED   Final   Culture  Setup Time     Final   Value: 06/02/2013 00:24     Performed at [facility]   Culture     Final   Value:        BLOOD CULTURE RECEIVED NO GROWTH TO DATE CULTURE WILL BE HELD FOR 5  DAYS BEFORE ISSUING A FINAL NEGATIVE REPORT     Performed at [facility]   Report Status PENDING   Incomplete  CULTURE, BLOOD (ROUTINE X 2)     Status: None   Collection Time    06/28/2013  6:55 PM      Result Value Ref Range Status   Specimen Description BLOOD LEFT FOREARM   Final   Special Requests BOTTLES DRAWN AEROBIC AND ANAEROBIC 5CC   Final   Culture  Setup Time     Final   Value: 06/02/2013 00:24     Performed at [facility]   Culture     Final   Value:        BLOOD CULTURE RECEIVED NO GROWTH TO DATE CULTURE WILL BE HELD FOR 5 DAYS BEFORE ISSUING A FINAL NEGATIVE REPORT     Performed at [facility]   Report Status PENDING   Incomplete  MRSA PCR SCREENING     Status: None   Collection Time    06/02/13 12:15 AM      Result Value Ref Range Status   MRSA by PCR NEGATIVE  NEGATIVE Final   Comment:  The GeneXpert MRSA Assay (FDA     approved for NASAL specimens     only), is one component of a     comprehensive MRSA colonization     surveillance program. It is not     intended to diagnose MRSA     infection nor to guide or     monitor treatment for     MRSA infections.  CULTURE, EXPECTORATED SPUTUM-ASSESSMENT     Status: None   Collection Time    06/03/13  5:41 AM      Result Value Ref Range Status   Specimen Description SPUTUM   Final   Special Requests NONE   Final   Sputum evaluation     Final   Value: THIS SPECIMEN IS ACCEPTABLE. RESPIRATORY CULTURE REPORT TO FOLLOW.   Report Status 06/03/2013 FINAL   Final  CULTURE, RESPIRATORY (NON-EXPECTORATED)     Status: None   Collection Time    06/03/13  5:41 AM      Result Value Ref Range Status   Specimen Description SPUTUM   Final   Special Requests NONE   Final   Gram Stain     Final   Value: MODERATE WBC PRESENT, PREDOMINANTLY PMN     RARE SQUAMOUS EPITHELIAL CELLS PRESENT     NO ORGANISMS SEEN     Performed at [facility]   Culture PENDING   Incomplete   Report Status  PENDING   Incomplete    Medical History: Past Medical History  Diagnosis Date  . Parkinson's disease   . Hypertension   . Atrial fibrillation   . Breast cancer   . Degenerative arthritis   . Diplopia 09/27/2012  . Scoliosis   . Small bowel obstruction  06/2007  . Asthma   . Hypopotassemia   . Orthostatic hypotension   . GERD (gastroesophageal reflux disease)     Medications:  See PTA medication list  Assessment: 80 y/o female who presents to the ED from home with SOB. Code sepsis called. Vancomycin started initially, now on Unasyn. WBC from 4/4 down to 18.0, currently afebrile Renal function is normal at ~ 44 ml/min  Plan: Continue Unasyn 1.5 grams IV q6h Monitor renal function, clinical course, and culture data May need to assess patient's anticoagulation based on CHADS2VASC score  Mann Skaggs B. Leitha Schuller, PharmD Clinical Pharmacist - Resident Phone: [phone] Pager: [phone] 06/04/2013 10:59 AM

## 2013-06-05 LAB — CBC
HEMATOCRIT: 43 % (ref 36.0–46.0)
HEMOGLOBIN: 13.3 g/dL (ref 12.0–15.0)
MCH: 30.9 pg (ref 26.0–34.0)
MCHC: 30.9 g/dL (ref 30.0–36.0)
MCV: 99.8 fL (ref 78.0–100.0)
Platelets: 345 10*3/uL (ref 150–400)
RBC: 4.31 MIL/uL (ref 3.87–5.11)
RDW: 14.5 % (ref 11.5–15.5)
WBC: 15.2 10*3/uL — ABNORMAL HIGH (ref 4.0–10.5)

## 2013-06-05 LAB — BASIC METABOLIC PANEL
BUN: 33 mg/dL — AB (ref 6–23)
CHLORIDE: 106 meq/L (ref 96–112)
CO2: 29 mEq/L (ref 19–32)
CREATININE: 0.57 mg/dL (ref 0.50–1.10)
Calcium: 9.8 mg/dL (ref 8.4–10.5)
GFR calc Af Amer: >90 mL/min (ref >90)
GFR, EST NON AFRICAN AMERICAN: 83 mL/min — AB (ref >90)
Glucose, Bld: 126 mg/dL — ABNORMAL HIGH (ref 70–99)
Potassium: 4.8 mEq/L (ref 3.7–5.3)
Sodium: 148 mEq/L — ABNORMAL HIGH (ref 137–147)

## 2013-06-05 LAB — CULTURE, RESPIRATORY W GRAM STAIN

## 2013-06-05 MED ORDER — SODIUM CHLORIDE 0.9 % IV SOLN
1000.0000 mL | INTRAVENOUS | Status: DC
Start: 2013-06-05 — End: 2013-06-06

## 2013-06-05 MED ORDER — HYDRALAZINE HCL 20 MG/ML IJ SOLN
5.0000 mg | Freq: Once | INTRAMUSCULAR | Status: AC
  Administered 2013-06-05: 5 mg via INTRAVENOUS

## 2013-06-05 MED ORDER — ACETAMINOPHEN 650 MG RE SUPP: 650.0000 mg | RECTAL | Status: DC | PRN

## 2013-06-05 MED ORDER — MORPHINE SULFATE 2 MG/ML IJ SOLN: 1.0000 mg | INTRAMUSCULAR | Status: DC | PRN

## 2013-06-05 MED ORDER — LORAZEPAM 2 MG/ML IJ SOLN: 0.5000 mg | INTRAMUSCULAR | Status: DC | PRN

## 2013-06-05 MED ORDER — HYDRALAZINE HCL 20 MG/ML IJ SOLN
10.0000 mg | Freq: Four times a day (QID) | INTRAMUSCULAR | Status: DC | PRN
  Administered 2013-06-05 (×2): 10 mg via INTRAVENOUS
  Filled 2013-06-05 (×2): qty 1

## 2013-06-05 NOTE — Progress Notes (Signed)
[facility]  Leslie Stevens:[id number] DOB: 1927/07/29 DOA: 06/10/2013 PCP: Vena Austria, MD  Time spent :  35 mins  Brief narrative: 80 y.o. female who presented to the ED in respiratory distress. SOB had been worsening over 3 days. At time of presentation to the ED she was desatting to 74%. She was placed on a NRB and sats returned to baseline.  HPI/Subjective: The patient's mental status has not improved since yesterday.  She is obtunded.  She does not appear to be uncomfortable.  Following my visit I was able to speak with her son over the telephone and we agree that a transition to a comfort directed focus is most appropriate at this time.   Assessment/Plan:  Acute respiratory failure with hypoxia and hyerpcarbia / multilobar aspiration pneumonia  -CT findings c/w an aspiration etiology -empiric anbx's have had no significant impact on her clinical course -SLP eval confirmed dysphagia - long discussion held w/ pt and children at bedside - pt was not interested in tube feeding of any kind, and family supportted this decision - dysphagia diet with acceptance of aspiration risk for comfort should patient awaken  Dysphagia  -see discussion above - likely postural (kyphosis) + muscular (Parkinson's) discoordination   Hypertension  Metabolic encephalopathy / Parkinson's disease -Unable to dose Sinemet due to obtundation -likely AMS from acute infection and hypoxia and now hypercarbia as well  -mental status has declined significantly during her hospital stay - see discussion above   Dehydration with hyponatremia  -resolved w/ volume resuscitation   Chronic Atrial fibrillation -Comfort directed focus - discontinue telemetry  GERD  DVT prophylaxis: None Code Status: DO NOT RESUSCITATE Family Communication: spoke w/ son via telephone Disposition Plan/Expected LOS: Transfer to medical bed for comfort directed hospice  care  Consultants: None  Procedures: None  Antibiotics: Unasyn 4/02 >>4/6 Zosyn 4/02 Vancomycin 4/02 >>>4/3  Objective: Blood pressure 154/55, pulse 89, temperature 99 F (37.2 C), temperature source Oral, resp. rate 25, height 5\' 3"  (1.6 m), weight 58.2 kg (128 lb 4.9 oz), SpO2 97.00%.  Intake/Output Summary (Last 24 hours) at 06/05/13 0902 Last data filed at 06/05/13 0600  Gross per 24 hour  Intake   1325 ml  Output      3 ml  Net   1322 ml   Exam: General: obtunded - kyphotic  Lungs: Coarse crackles B bases - poor air movement th/o despite Cardiovascular: Irregular rate and rhythm without murmur gallop or rub, no peripheral edema  Abdomen: Nondistended, soft, bowel sounds hypoactive, no rebound, no ascites, no appreciable mass Musculoskeletal: No significant cyanosis, clubbing of bilateral lower extremities  Scheduled Meds:  Scheduled Meds: . ampicillin-sulbactam (UNASYN) IV  1.5 g Intravenous Q6H  . carbidopa-levodopa  0.5 tablet Oral TID WC  . darifenacin  7.5 mg Oral Daily  . digoxin  0.125 mg Oral QHS  . docusate sodium  100 mg Oral QHS  . guaiFENesin  600 mg Oral BID  . heparin  5,000 Units Subcutaneous 3 times per day  . loratadine  10 mg Oral Daily  . mirabegron ER  50 mg Oral Daily  . propranolol  40 mg Oral BID   Data Reviewed: Basic Metabolic Panel:  Recent Labs Lab 2013-06-10 1721 06/02/13 0313 06/03/13 0305 06/05/13 0313  NA 135* 132* 140 148*  K 4.4 4.1 3.8 4.8  CL 94* 95* 100 106  CO2 25 23 26 29   GLUCOSE 105* 91 100* 126*  BUN 16 14 16  33*  CREATININE 0.49* 0.47* 0.53 0.57  CALCIUM 9.1 8.5 8.8 9.8   Liver Function Tests:  Recent Labs Lab 07-01-2013 1721  AST 31  ALT 11  ALKPHOS 117  BILITOT 0.5  PROT 6.8  ALBUMIN 3.1*   CBC:  Recent Labs Lab 07-01-2013 1721 06/02/13 0313 06/03/13 0305 06/05/13 0313  WBC 30.4* 29.0* 18.0* 15.2*  NEUTROABS 28.9*  --   --   --   HGB 14.3 12.6 12.3 13.3  HCT 41.3 38.0 37.1 43.0  MCV 92.2  93.4 93.5 99.8  PLT 269 264 266 345    Recent Results (from the past 240 hour(s))  URINE CULTURE     Status: None   Collection Time    2013/07/01  5:30 PM      Result Value Ref Range Status   Specimen Description URINE, CATHETERIZED   Final   Special Requests NONE   Final   Culture  Setup Time     Final   Value: 07/01/13 22:02     Performed at [facility]     Final   Value: NO GROWTH     Performed at [facility]   Culture     Final   Value: NO GROWTH     Performed at [facility]   Report Status 06/02/2013 FINAL   Final  CULTURE, BLOOD (ROUTINE X 2)     Status: None   Collection Time    Jul 01, 2013  6:20 PM      Result Value Ref Range Status   Specimen Description BLOOD HAND RIGHT   Final   Special Requests BOTTLES DRAWN AEROBIC AND ANAEROBIC 5CCBLUE 2CCRED   Final   Culture  Setup Time     Final   Value: 06/02/2013 00:24     Performed at [facility]   Culture     Final   Value:        BLOOD CULTURE RECEIVED NO GROWTH TO DATE CULTURE WILL BE HELD FOR 5 DAYS BEFORE ISSUING A FINAL NEGATIVE REPORT     Performed at [facility]   Report Status PENDING   Incomplete  CULTURE, BLOOD (ROUTINE X 2)     Status: None   Collection Time    07/01/13  6:55 PM      Result Value Ref Range Status   Specimen Description BLOOD LEFT FOREARM   Final   Special Requests BOTTLES DRAWN AEROBIC AND ANAEROBIC 5CC   Final   Culture  Setup Time     Final   Value: 06/02/2013 00:24     Performed at [facility]   Culture     Final   Value:        BLOOD CULTURE RECEIVED NO GROWTH TO DATE CULTURE WILL BE HELD FOR 5 DAYS BEFORE ISSUING A FINAL NEGATIVE REPORT     Performed at [facility]   Report Status PENDING   Incomplete  MRSA PCR SCREENING     Status: None   Collection Time    06/02/13 12:15 AM      Result Value Ref Range Status   MRSA by PCR NEGATIVE  NEGATIVE Final   Comment:            The GeneXpert MRSA Assay (FDA      approved for NASAL specimens     only), is one component of a     comprehensive MRSA colonization  surveillance program. It is not     intended to diagnose MRSA     infection nor to guide or     monitor treatment for     MRSA infections.  CULTURE, EXPECTORATED SPUTUM-ASSESSMENT     Status: None   Collection Time    06/03/13  5:41 AM      Result Value Ref Range Status   Specimen Description SPUTUM   Final   Special Requests NONE   Final   Sputum evaluation     Final   Value: THIS SPECIMEN IS ACCEPTABLE. RESPIRATORY CULTURE REPORT TO FOLLOW.   Report Status 06/03/2013 FINAL   Final  CULTURE, RESPIRATORY (NON-EXPECTORATED)     Status: None   Collection Time    06/03/13  5:41 AM      Result Value Ref Range Status   Specimen Description SPUTUM   Final   Special Requests NONE   Final   Gram Stain     Final   Value: MODERATE WBC PRESENT, PREDOMINANTLY PMN     RARE SQUAMOUS EPITHELIAL CELLS PRESENT     NO ORGANISMS SEEN     Performed at [facility]   Culture     Final   Value: FEW CANDIDA ALBICANS     Performed at [facility]   Report Status PENDING   Incomplete     Studies:  Recent x-ray studies have been reviewed in detail by the Attending Physician   Cherene Altes, MD [facility] For Consults/Admissions - Flow Manager - [phone] Office  [phone] Pager [phone]  On-Call/Text Page:      [email or web address]      password [facility]  06/05/2013, 9:02 AM   LOS: 4 days

## 2013-06-05 NOTE — Progress Notes (Signed)
INITIAL NUTRITION ASSESSMENT  DOCUMENTATION CODES Per approved criteria  -Severe malnutrition in the context of chronic illness   INTERVENTION: 1.  Modify diet; resume PO diet once medically appropriate per MD discretion and as medical status improved.  No supplements added due to increased WOB and in lethargy.   NUTRITION DIAGNOSIS: Inadequate oral intake related to inability to eat as evidenced by medical decline.   Monitor:  1.  Enteral nutrition; initiation with tolerance.  Pt to meet >/=90% estimated needs with nutrition support.  2.  Wt/wt change; monitor trends  Reason for Assessment: MST  80 y.o. female  Admitting Dx: shortness of breath  ASSESSMENT: Pt admitted with shortness of breath; found to have multilobar aspiration pneumonia. Pt with metabolic encephalopathy and worsening hypoxic respiratory failure.  Pt assessed by SLP who determined significant aspiration risk.  Currently on clear liquids with accepted risk.  Pt not alert during visit with RD today.   Nutrition Focused Physical Exam: Subcutaneous Fat:  Orbital Region: severe wasting Upper Arm Region: mild wasting Thoracic and Lumbar Region: severe wasting  Muscle:  Temple Region: severe wasting Clavicle Bone Region: severe wasting Clavicle and Acromion Bone Region: severe wasting Scapular Bone Region: severe wasting Dorsal Hand: edema present Patellar Region: WNL Anterior Thigh Region: mild-moderate wasting.  Posterior Calf Region: WNL  Edema: present  Height: Ht Readings from Last 1 Encounters:  06/02/13 5\' 3"  (1.6 m)    Weight: Wt Readings from Last 1 Encounters:  06/02/13 128 lb 4.9 oz (58.2 kg)    Ideal Body Weight: 115 lbs  % Ideal Body Weight: 111%  Wt Readings from Last 10 Encounters:  06/02/13 128 lb 4.9 oz (58.2 kg)  12/13/12 127 lb (57.607 kg)  05/05/12 140 lb (63.504 kg)    Usual Body Weight: unknown   % Usual Body Weight: unable to assess  BMI:  Body mass index is  22.73 kg/(m^2).  Estimated Nutritional Needs: Kcal: 1500-1650 Protein: 70-87g Fluid: >1.5 L/day  Skin: generalized edema, otherwise intact  Diet Order: Clear Liquid  EDUCATION NEEDS: -Education not appropriate at this time   Intake/Output Summary (Last 24 hours) at 06/05/13 1046 Last data filed at 06/05/13 1000  Gross per 24 hour  Intake   1525 ml  Output      3 ml  Net   1522 ml    Last BM: 4/4  Labs:   Recent Labs Lab 06/02/13 0313 06/03/13 0305 06/05/13 0313  NA 132* 140 148*  K 4.1 3.8 4.8  CL 95* 100 106  CO2 23 26 29   BUN 14 16 33*  CREATININE 0.47* 0.53 0.57  CALCIUM 8.5 8.8 9.8  GLUCOSE 91 100* 126*    CBG (last 3)   Recent Labs  06/04/13 0518  GLUCAP 110*    Scheduled Meds: . ampicillin-sulbactam (UNASYN) IV  1.5 g Intravenous Q6H  . carbidopa-levodopa  0.5 tablet Oral TID WC  . darifenacin  7.5 mg Oral Daily  . digoxin  0.125 mg Oral QHS  . docusate sodium  100 mg Oral QHS  . guaiFENesin  600 mg Oral BID  . heparin  5,000 Units Subcutaneous 3 times per day  . loratadine  10 mg Oral Daily  . mirabegron ER  50 mg Oral Daily  . propranolol  40 mg Oral BID    Continuous Infusions: . sodium chloride 1,000 mL (06/04/13 2318)    Past Medical History  Diagnosis Date  . Parkinson's disease   . Hypertension   . Atrial  fibrillation   . Breast cancer   . Degenerative arthritis   . Diplopia 09/27/2012  . Scoliosis   . Small bowel obstruction  06/2007  . Asthma   . Hypopotassemia   . Orthostatic hypotension   . GERD (gastroesophageal reflux disease)     Past Surgical History  Procedure Laterality Date  . Mastectomy    . Total hip arthroplasty Right   . Lumbar spine surgery    . Tonsillectomy    . Cataract extraction Bilateral     Brynda Greathouse, MS RD LDN Clinical Inpatient Dietitian Pager: [phone] Weekend/After hours pager: [phone]

## 2013-06-05 NOTE — ED Provider Notes (Signed)
I saw and evaluated the patient, reviewed the resident's note and I agree with the findings and plan.   EKG Interpretation   Date/Time:  June 27, 2013 17:19:38 EDT Ventricular Rate:  102 PR Interval:    QRS Duration: 93 QT Interval:  305 QTC Calculation: 397 R Axis:   90 Text Interpretation:  Atrial fibrillation Borderline right axis deviation  Anteroseptal infarct, old Repol abnrm, severe global ischemia (LM/MVD) TW  depressions new since previous  Confirmed by YAO  MD, DAVID ([id number]) on  06/27/13 5:24:39 PM      Leslie Stevens is a 80 y.o. female hx of parkinsons, afib here with SOB. Has been exposed to flu. Was noted to be hypoxic in the 98s but maintaining O2 sat on nonrebreather. Dec breath sounds bilaterally. WBC 30, cxr unremarkable. CT angio showed pneumonia. Flu sent, treated for pneumonia, flu. Will admit.   Level V caveat- dementia    Wandra Arthurs, MD 06/05/13 [phone]

## 2013-06-05 NOTE — Progress Notes (Addendum)
Report called to [place] rn. No questions or concerns regarding transfer.      Violet Cart RN

## 2013-06-08 LAB — CULTURE, BLOOD (ROUTINE X 2)
CULTURE: NO GROWTH
Culture: NO GROWTH

## 2013-06-19 NOTE — Discharge Summary (Addendum)
Death Summary  Leslie Stevens ZCH:[id number] DOB: 10-20-1927 DOA: 06/05/2013  PCP: Vena Austria, MD  Admit date: 2013-06-05 Date of Death: 06-10-13  Final Diagnoses:  Acute respiratory failure with hypoxia and hyerpcarbia / multilobar aspiration pneumonia   Dysphagia  Metabolic encephalopathy / Parkinson's disease  Dehydration with hyponatremia  Chronic Atrial fibrillation   GERD   History of present illness:  80 y.o. female who presented to the ED in respiratory distress. SOB had been worsening over 3 days. At time of presentation to the ED she was desatting to 74%.   Hospital Course:   Acute respiratory failure with hypoxia and hyerpcarbia / multilobar aspiration pneumonia  -CT findings c/w an aspiration etiology  -empiric anbx's had no significant impact on her clinical course  -SLP eval confirmed dysphagia - long discussion held w/ pt and children at bedside - pt was not interested in tube feeding of any kind, and family supported this decision  - despite ongoing supportive care, after an initial period of stabilization, the pt developed resp distress again, and declined abruptly to the point of dying - she died on 06-09-2013   Other active issues during the hospital stay included:  Dysphagia  -see discussion above - likely postural (kyphosis) + muscular (Parkinson's) discoordination   Metabolic encephalopathy / Parkinson's disease  -Unable to dose Sinemet due to obtundation  -likely AMS from acute infection and hypoxia and hypercarbia as well  -mental status declined significantly during her hospital stay - see discussion above   Dehydration with hyponatremia  -resolved w/ volume resuscitation initially    Chronic Atrial fibrillation  -Comfort directed focus  Signed:  Cherene Altes  [facility] 06/19/2013, 6:44 PM

## 2013-06-30 DEATH — deceased

## 2013-10-10 ENCOUNTER — Ambulatory Visit: Payer: Self-pay | Admitting: Neurology

## 2014-04-28 IMAGING — CT CT ANGIO CHEST
2 of 8 series · 17 of 46 positions shown · IV contrast (APPLIED)
Comparison: Chest radiograph performed earlier today at [DATE] p.m.

CLINICAL DATA: Shortness of breath and cough.

EXAM:
CT ANGIOGRAPHY CHEST WITH CONTRAST
TECHNIQUE: Multidetector CT imaging of the chest was performed using the
standard protocol during bolus administration of intravenous
contrast. Multiplanar CT image reconstructions and MIPs were
obtained to evaluate the vascular anatomy.
CONTRAST:  100mL OMNIPAQUE IOHEXOL 350 MG/ML SOLN

[Series 6: thins · axial · 0.63mm/px · z∈[-329,-89]mm · 14 of 265 slices shown]
[im 13/265  lung]
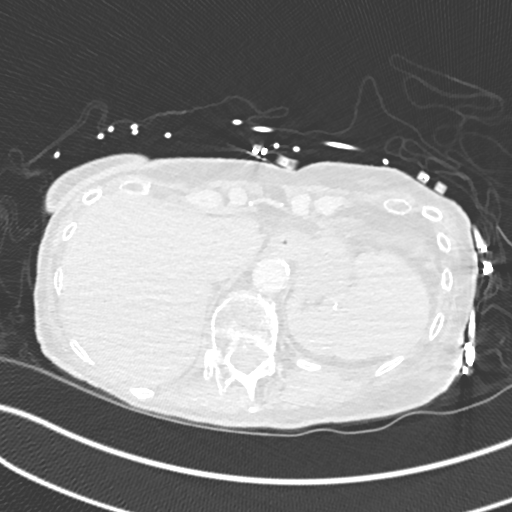
[im 37/265  soft-tissue]
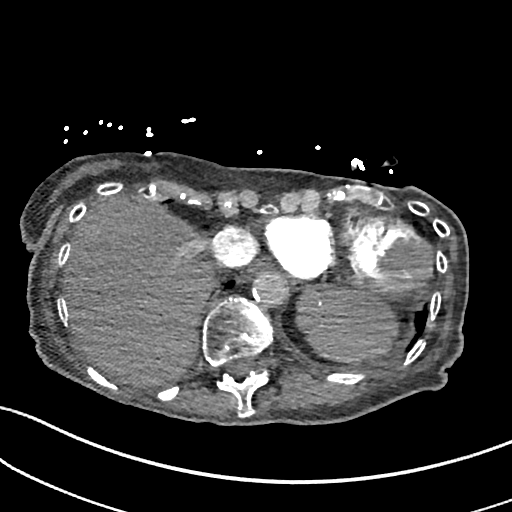
[im 49/265  lung]
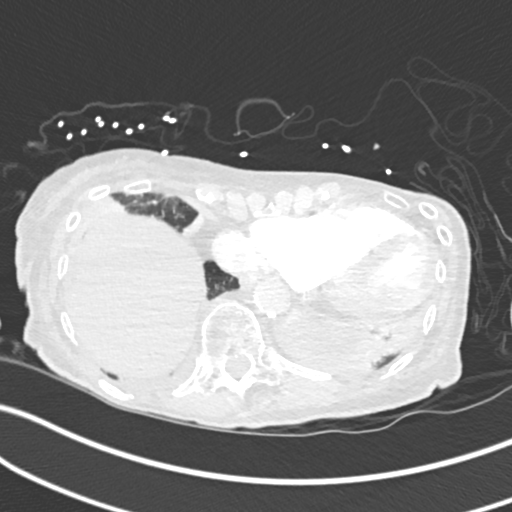
[im 73/265  soft-tissue]
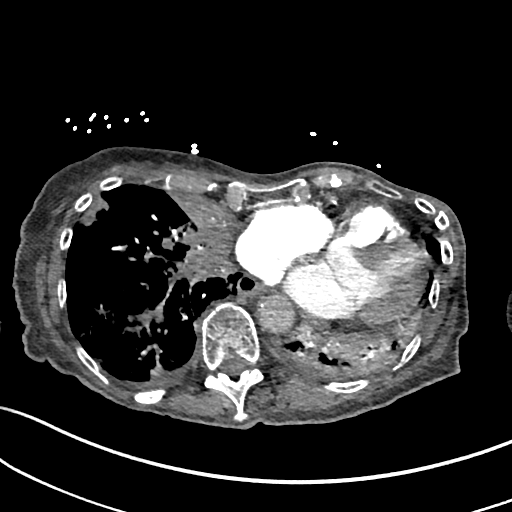
[im 85/265  lung]
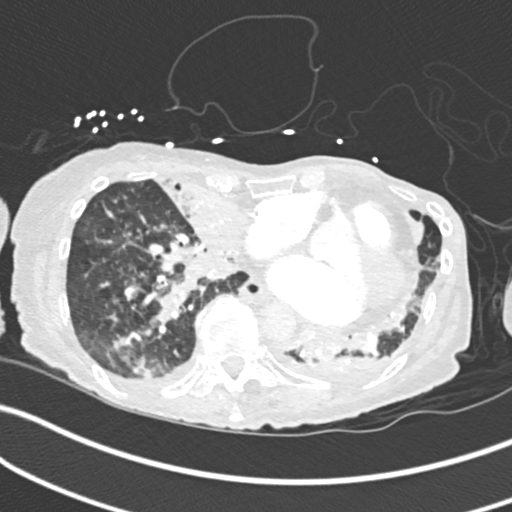
[im 109/265  soft-tissue]
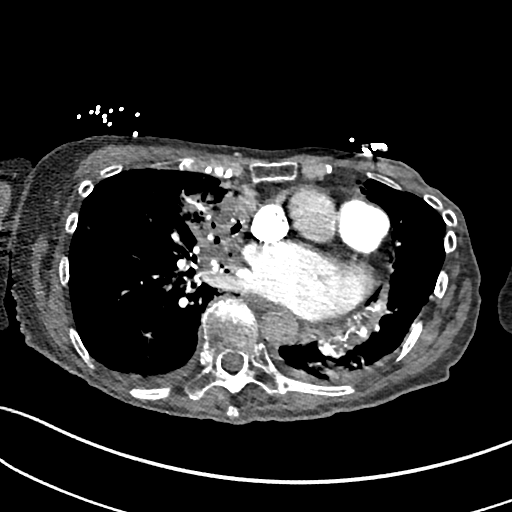
[im 121/265  lung]
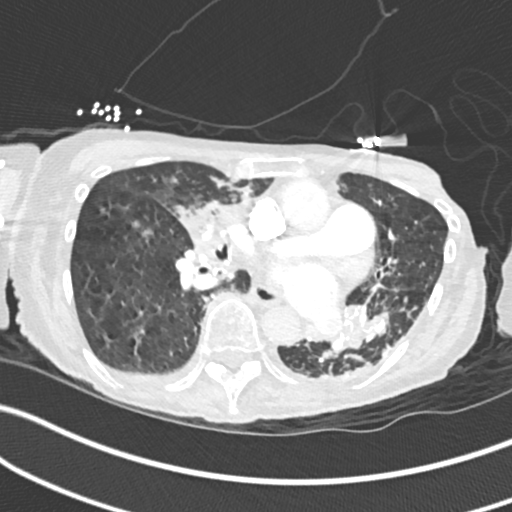
[im 145/265  soft-tissue]
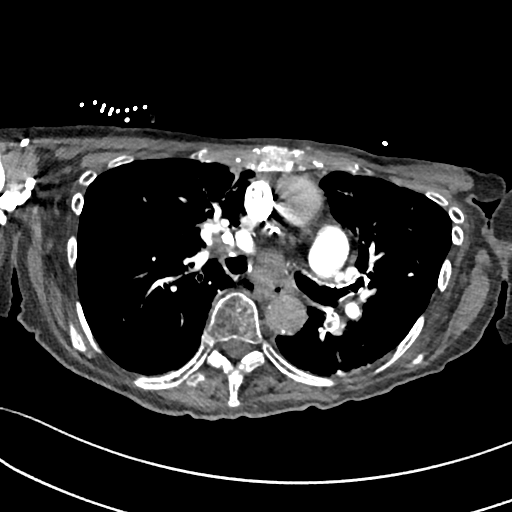
[im 157/265  lung]
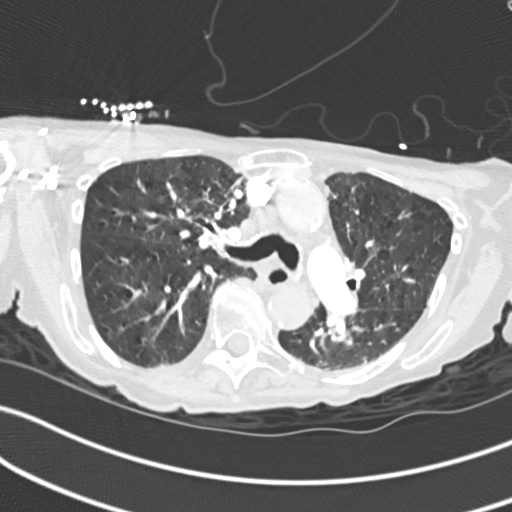
[im 181/265  soft-tissue]
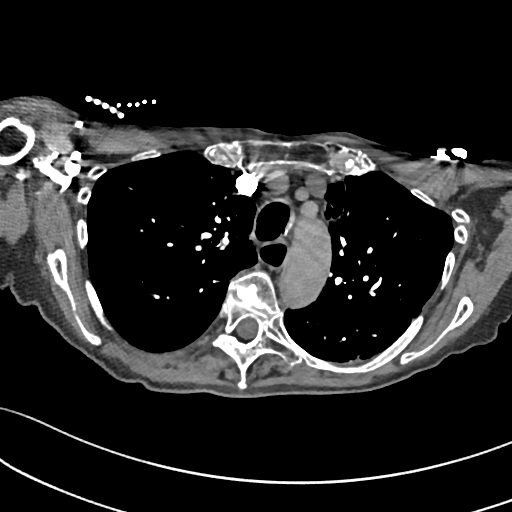
[im 193/265  lung]
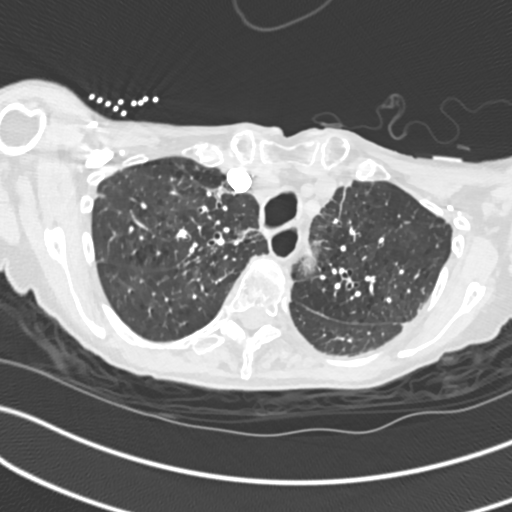
[im 217/265  soft-tissue]
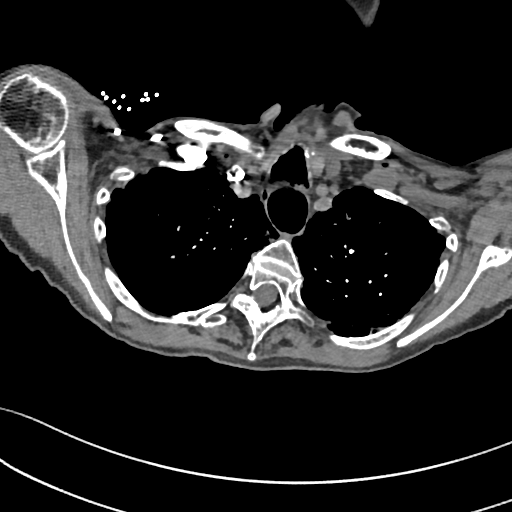
[im 229/265  lung]
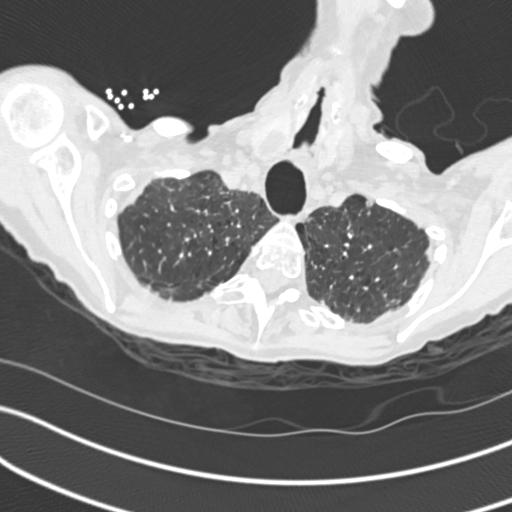
[im 253/265  soft-tissue]
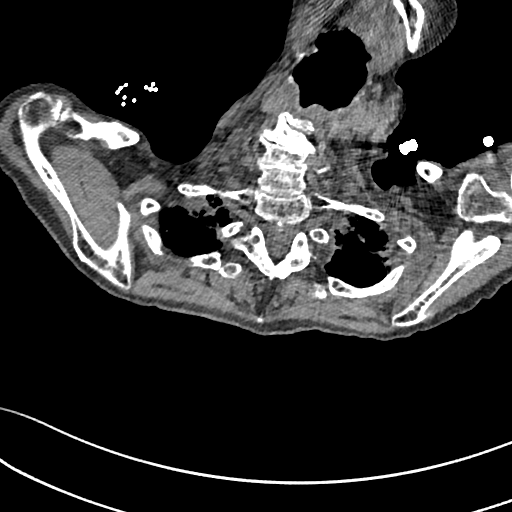

[Series 8: coronal mpr · coronal · 0.59mm/px · 3 of 89 slices shown]
[im 23/89  soft-tissue]
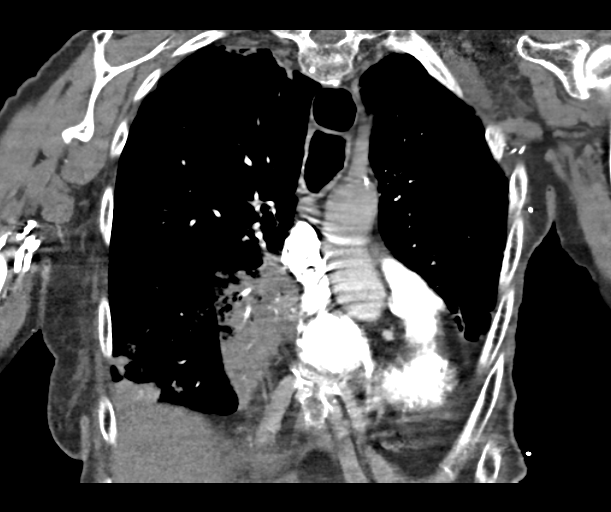
[im 45/89  soft-tissue]
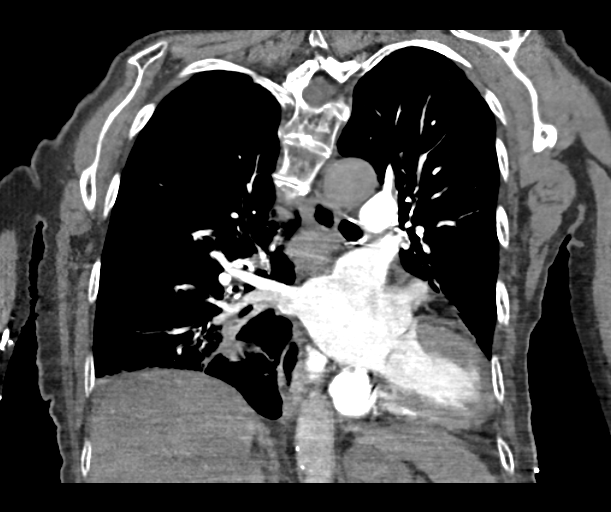
[im 67/89  soft-tissue]
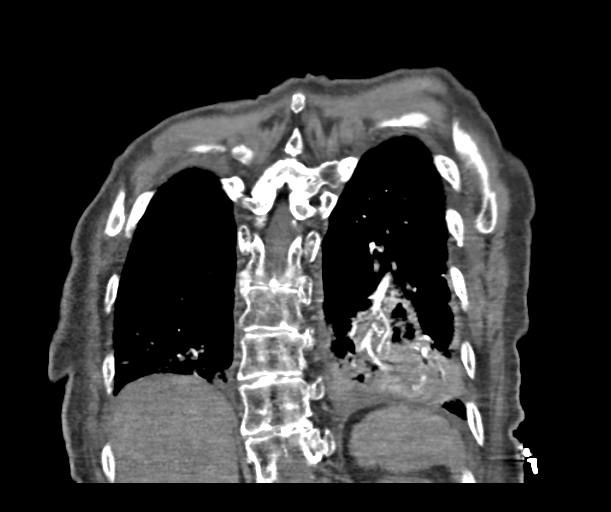

[17 of 46 positions shown; findings below may reference images not displayed]

FINDINGS: There is no evidence of pulmonary embolus.

Dense airspace opacification is noted involving the inferior aspect
of the right upper lobe and portions of the left lower lobe.
Additional scattered airspace opacities are seen within the right
lower lobe, and scattered nonspecific peripheral nodules are seen
within the right upper and middle lung lobes. There is a significant
amount of fluid seen filling the bronchus and bronchioles to the
left lower lobe, compatible with aspiration. Trace bilateral pleural
effusions are seen. Minimal underlying emphysematous change is noted
at the upper lung lobes. There is no evidence of pneumothorax.

A 1.6 cm node is seen at the subcarinal region. Scattered coronary
artery calcifications are seen. There is mild biatrial enlargement.
No pericardial effusion is identified. The great vessels are grossly
unremarkable in appearance. No axillary lymphadenopathy is seen. A
2.3 cm hypodensity is noted at the right thyroid lobe.

The visualized portions of the liver and spleen are unremarkable.
The patient is status post left-sided mastectomy.

No acute osseous abnormalities are seen. There is chronic grade 2
anterolisthesis of C6 on C7, with associated degenerative change
along the cervical spine.

Review of the MIP images confirms the above findings.
IMPRESSION: 1. No evidence for pulmonary embolus.
2. Dense airspace opacification involving the inferior aspect of the
right upper lobe and portions of the left lower lobe, compatible
with significant pneumonia. Significant amount of fluid seen filling
the bronchus and bronchioles to the left lower lobe, compatible with
aspiration.
3. Additional scattered airspace opacities in the right lower lobe,
and nonspecific peripheral nodules in the right upper and middle
lung lobes. These are most likely infectious in nature, though would
consider follow-up CT of the chest after completion of treatment for
pneumonia, to ensure resolution of pulmonary nodules, as deemed
clinically appropriate.
4. Trace bilateral pleural effusion seen.
5. Minimal emphysematous change noted at the upper lung lobes.
6. Scattered coronary artery calcifications seen.
7. 1.6 cm node at the subcarinal region may reflect the acute
infectious process.
8. Mild biatrial enlargement noted.
9. 2.3 cm hypodensity at the right thyroid lobe. Consider further
evaluation with thyroid ultrasound. If patient is clinically
hyperthyroid, consider nuclear medicine thyroid uptake and scan.

These results were called by telephone at the time of interpretation
on 06/01/2013 at [DATE] to Dr. CESI IGWE , who verbally
acknowledged these results.
